# Patient Record
Sex: Female | Born: 1990 | Hispanic: No | Marital: Married | Smoking: Never smoker
Health system: Southern US, Community
[De-identification: ages and names within clinical notes are randomized; demographics above are authoritative.]

## PROBLEM LIST (undated history)

## (undated) DIAGNOSIS — Z8632 Personal history of gestational diabetes: Secondary | ICD-10-CM

## (undated) DIAGNOSIS — O09299 Supervision of pregnancy with other poor reproductive or obstetric history, unspecified trimester: Secondary | ICD-10-CM

## (undated) DIAGNOSIS — O24419 Gestational diabetes mellitus in pregnancy, unspecified control: Secondary | ICD-10-CM

## (undated) HISTORY — DX: Supervision of pregnancy with other poor reproductive or obstetric history, unspecified trimester: O09.299

## (undated) HISTORY — PX: WISDOM TOOTH EXTRACTION: SHX21

## (undated) HISTORY — PX: APPENDECTOMY: SHX54

## (undated) HISTORY — DX: Gestational diabetes mellitus in pregnancy, unspecified control: O24.419

## (undated) HISTORY — DX: Supervision of pregnancy with other poor reproductive or obstetric history, unspecified trimester: Z86.32

## (undated) HISTORY — PX: OTHER SURGICAL HISTORY: SHX169

---

## 2011-08-18 ENCOUNTER — Encounter (HOSPITAL_COMMUNITY): Payer: Self-pay | Admitting: Anesthesiology

## 2011-08-18 ENCOUNTER — Emergency Department (HOSPITAL_COMMUNITY): Payer: BC Managed Care – PPO

## 2011-08-18 ENCOUNTER — Encounter (HOSPITAL_COMMUNITY): Payer: Self-pay | Admitting: *Deleted

## 2011-08-18 ENCOUNTER — Emergency Department (HOSPITAL_COMMUNITY): Payer: BC Managed Care – PPO | Admitting: Anesthesiology

## 2011-08-18 ENCOUNTER — Observation Stay (HOSPITAL_COMMUNITY)
Admission: EM | Admit: 2011-08-18 | Discharge: 2011-08-19 | Disposition: A | Discharge status: Home / Self Care | Payer: BC Managed Care – PPO | Attending: General Surgery | Admitting: General Surgery

## 2011-08-18 ENCOUNTER — Encounter (HOSPITAL_COMMUNITY)
Admission: EM | Disposition: A | Discharge status: Home / Self Care | Payer: Self-pay | Source: Home / Self Care | Attending: Emergency Medicine

## 2011-08-18 DIAGNOSIS — K37 Unspecified appendicitis: Secondary | ICD-10-CM

## 2011-08-18 DIAGNOSIS — R1011 Right upper quadrant pain: Secondary | ICD-10-CM

## 2011-08-18 DIAGNOSIS — K3580 Unspecified acute appendicitis: Principal | ICD-10-CM | POA: Insufficient documentation

## 2011-08-18 HISTORY — PX: LAPAROSCOPIC APPENDECTOMY: SHX408

## 2011-08-18 LAB — DIFFERENTIAL
Basophils Absolute: 0 10*3/uL (ref 0.0–0.1)
Basophils Relative: 0 % (ref 0–1)
Eosinophils Absolute: 0.1 10*3/uL (ref 0.0–0.7)
Eosinophils Relative: 1 % (ref 0–5)

## 2011-08-18 LAB — WET PREP, GENITAL: Clue Cells Wet Prep HPF POC: NONE SEEN

## 2011-08-18 LAB — CBC
MCH: 28.8 pg (ref 26.0–34.0)
MCV: 83.8 fL (ref 78.0–100.0)
Platelets: 287 10*3/uL (ref 150–400)
RDW: 12.7 % (ref 11.5–15.5)

## 2011-08-18 LAB — COMPREHENSIVE METABOLIC PANEL
ALT: 10 U/L (ref 0–35)
AST: 13 U/L (ref 0–37)
Calcium: 8.9 mg/dL (ref 8.4–10.5)
GFR calc Af Amer: >90 mL/min (ref >90)
Sodium: 135 mEq/L (ref 135–145)
Total Protein: 7.1 g/dL (ref 6.0–8.3)

## 2011-08-18 SURGERY — APPENDECTOMY, LAPAROSCOPIC
Anesthesia: General | Site: Abdomen | Wound class: Clean Contaminated

## 2011-08-18 MED ORDER — MORPHINE SULFATE 4 MG/ML IJ SOLN
4.0000 mg | Freq: Once | INTRAMUSCULAR | Status: AC
  Administered 2011-08-18: 4 mg via INTRAVENOUS
  Filled 2011-08-18: qty 1

## 2011-08-18 MED ORDER — SODIUM CHLORIDE 0.9 % IV BOLUS (SEPSIS)
1000.0000 mL | Freq: Once | INTRAVENOUS | Status: AC
  Administered 2011-08-18: 1000 mL via INTRAVENOUS

## 2011-08-18 MED ORDER — PROPOFOL 10 MG/ML IV EMUL
INTRAVENOUS | Status: DC | PRN
  Administered 2011-08-18: 120 mg via INTRAVENOUS

## 2011-08-18 MED ORDER — DEXAMETHASONE SODIUM PHOSPHATE 10 MG/ML IJ SOLN
INTRAMUSCULAR | Status: DC | PRN
  Administered 2011-08-18: 6 mg via INTRAVENOUS

## 2011-08-18 MED ORDER — ACETAMINOPHEN 650 MG RE SUPP: 650.0000 mg | Freq: Four times a day (QID) | RECTAL | Status: DC | PRN

## 2011-08-18 MED ORDER — NEOSTIGMINE METHYLSULFATE 1 MG/ML IJ SOLN
INTRAMUSCULAR | Status: DC | PRN
  Administered 2011-08-18: 3 mg via INTRAVENOUS

## 2011-08-18 MED ORDER — IOHEXOL 300 MG/ML  SOLN
100.0000 mL | Freq: Once | INTRAMUSCULAR | Status: AC | PRN
  Administered 2011-08-18: 100 mL via INTRAVENOUS

## 2011-08-18 MED ORDER — FENTANYL CITRATE 0.05 MG/ML IJ SOLN
INTRAMUSCULAR | Status: DC | PRN
  Administered 2011-08-18 (×2): 50 ug via INTRAVENOUS

## 2011-08-18 MED ORDER — MIDAZOLAM HCL 5 MG/5ML IJ SOLN
INTRAMUSCULAR | Status: DC | PRN
  Administered 2011-08-18: 1.5 mg via INTRAVENOUS

## 2011-08-18 MED ORDER — MORPHINE SULFATE 2 MG/ML IJ SOLN: 2.0000 mg | INTRAMUSCULAR | Status: DC | PRN

## 2011-08-18 MED ORDER — KETOROLAC TROMETHAMINE 15 MG/ML IJ SOLN
INTRAMUSCULAR | Status: DC | PRN
  Administered 2011-08-18: 15 mg via INTRAVENOUS

## 2011-08-18 MED ORDER — LACTATED RINGERS IV SOLN: INTRAVENOUS | Status: DC

## 2011-08-18 MED ORDER — PIPERACILLIN-TAZOBACTAM 3.375 G IVPB
3.3750 g | Freq: Three times a day (TID) | INTRAVENOUS | Status: AC
  Administered 2011-08-18 – 2011-08-19 (×2): 3.375 g via INTRAVENOUS
  Filled 2011-08-18 (×3): qty 50

## 2011-08-18 MED ORDER — BUPIVACAINE HCL (PF) 0.25 % IJ SOLN
INTRAMUSCULAR | Status: AC
  Filled 2011-08-18: qty 30

## 2011-08-18 MED ORDER — CISATRACURIUM BESYLATE 2 MG/ML IV SOLN
INTRAVENOUS | Status: DC | PRN
  Administered 2011-08-18: 4 mg via INTRAVENOUS

## 2011-08-18 MED ORDER — ACETAMINOPHEN 325 MG PO TABS: 650.0000 mg | ORAL_TABLET | Freq: Four times a day (QID) | ORAL | Status: DC | PRN

## 2011-08-18 MED ORDER — SODIUM CHLORIDE 0.9 % IV SOLN
INTRAVENOUS | Status: DC
  Administered 2011-08-18: 21:00:00 via INTRAVENOUS

## 2011-08-18 MED ORDER — ONDANSETRON HCL 4 MG/2ML IJ SOLN: 4.0000 mg | Freq: Four times a day (QID) | INTRAMUSCULAR | Status: DC | PRN

## 2011-08-18 MED ORDER — GLYCOPYRROLATE 0.2 MG/ML IJ SOLN
INTRAMUSCULAR | Status: DC | PRN
  Administered 2011-08-18: 0.4 mg via INTRAVENOUS

## 2011-08-18 MED ORDER — HYDROMORPHONE HCL PF 1 MG/ML IJ SOLN: 0.2500 mg | INTRAMUSCULAR | Status: DC | PRN

## 2011-08-18 MED ORDER — BUPIVACAINE HCL (PF) 0.25 % IJ SOLN
INTRAMUSCULAR | Status: DC | PRN
  Administered 2011-08-18: 30 mL

## 2011-08-18 MED ORDER — LACTATED RINGERS IV SOLN
INTRAVENOUS | Status: DC | PRN
  Administered 2011-08-18: 1000 mL via INTRAVENOUS

## 2011-08-18 MED ORDER — PIPERACILLIN-TAZOBACTAM 3.375 G IVPB
3.3750 g | Freq: Once | INTRAVENOUS | Status: DC
  Filled 2011-08-18: qty 50

## 2011-08-18 MED ORDER — ONDANSETRON HCL 4 MG/2ML IJ SOLN
INTRAMUSCULAR | Status: DC | PRN
  Administered 2011-08-18: 4 mg via INTRAVENOUS

## 2011-08-18 MED ORDER — POTASSIUM CHLORIDE 20 MEQ/15ML (10%) PO LIQD
20.0000 meq | Freq: Once | ORAL | Status: AC
  Administered 2011-08-18: 20 meq via ORAL
  Filled 2011-08-18: qty 15

## 2011-08-18 MED ORDER — LACTATED RINGERS IV SOLN
INTRAVENOUS | Status: DC | PRN
  Administered 2011-08-18: 18:00:00 via INTRAVENOUS

## 2011-08-18 MED ORDER — SUCCINYLCHOLINE CHLORIDE 20 MG/ML IJ SOLN
INTRAMUSCULAR | Status: DC | PRN
  Administered 2011-08-18: 80 mg via INTRAVENOUS

## 2011-08-18 MED ORDER — OXYCODONE HCL 5 MG PO TABS: 5.0000 mg | ORAL_TABLET | ORAL | Status: DC | PRN

## 2011-08-18 MED FILL — Cisatracurium Besylate (PF) IV Soln 10 MG/5ML (2 MG/ML): INTRAVENOUS | Qty: 5 | Status: AC

## 2011-08-18 SURGICAL SUPPLY — 38 items
CABLE HIGH FREQUENCY MONO STRZ (ELECTRODE) ×2 IMPLANT
CANISTER SUCTION 2500CC (MISCELLANEOUS) ×2 IMPLANT
CHLORAPREP W/TINT 26ML (MISCELLANEOUS) ×2 IMPLANT
CLOTH BEACON ORANGE TIMEOUT ST (SAFETY) ×2 IMPLANT
COVER SURGICAL LIGHT HANDLE (MISCELLANEOUS) ×2 IMPLANT
CUTTER FLEX LINEAR 45M (STAPLE) ×2 IMPLANT
DECANTER SPIKE VIAL GLASS SM (MISCELLANEOUS) ×2 IMPLANT
DERMABOND ADVANCED (GAUZE/BANDAGES/DRESSINGS) ×1
DERMABOND ADVANCED .7 DNX12 (GAUZE/BANDAGES/DRESSINGS) ×1 IMPLANT
DRAPE LAPAROSCOPIC ABDOMINAL (DRAPES) ×2 IMPLANT
ELECT REM PT RETURN 9FT ADLT (ELECTROSURGICAL) ×2
ELECTRODE REM PT RTRN 9FT ADLT (ELECTROSURGICAL) ×1 IMPLANT
GLOVE BIO SURGEON STRL SZ7 (GLOVE) ×4 IMPLANT
GLOVE BIOGEL PI IND STRL 7.0 (GLOVE) ×1 IMPLANT
GLOVE BIOGEL PI IND STRL 7.5 (GLOVE) ×2 IMPLANT
GLOVE BIOGEL PI INDICATOR 7.0 (GLOVE) ×1
GLOVE BIOGEL PI INDICATOR 7.5 (GLOVE) ×2
GOWN PREVENTION PLUS LG XLONG (DISPOSABLE) ×2 IMPLANT
GOWN PREVENTION PLUS XLARGE (GOWN DISPOSABLE) ×2 IMPLANT
GOWN STRL NON-REIN LRG LVL3 (GOWN DISPOSABLE) ×2 IMPLANT
GOWN STRL REIN XL XLG (GOWN DISPOSABLE) ×2 IMPLANT
KIT BASIN OR (CUSTOM PROCEDURE TRAY) ×2 IMPLANT
NS IRRIG 1000ML POUR BTL (IV SOLUTION) ×2 IMPLANT
PENCIL BUTTON HOLSTER BLD 10FT (ELECTRODE) IMPLANT
POUCH SPECIMEN RETRIEVAL 10MM (ENDOMECHANICALS) ×2 IMPLANT
RELOAD 45 VASCULAR/THIN (ENDOMECHANICALS) IMPLANT
RELOAD STAPLE TA45 3.5 REG BLU (ENDOMECHANICALS) ×2 IMPLANT
SCALPEL HARMONIC ACE (MISCELLANEOUS) ×2 IMPLANT
SET IRRIG TUBING LAPAROSCOPIC (IRRIGATION / IRRIGATOR) ×2 IMPLANT
SOLUTION ANTI FOG 6CC (MISCELLANEOUS) ×2 IMPLANT
SUT MNCRL AB 4-0 PS2 18 (SUTURE) ×2 IMPLANT
SUT VICRYL 0 ENDOLOOP (SUTURE) IMPLANT
TOWEL OR 17X26 10 PK STRL BLUE (TOWEL DISPOSABLE) ×2 IMPLANT
TRAY FOLEY CATH 14FRSI W/METER (CATHETERS) ×2 IMPLANT
TRAY LAP CHOLE (CUSTOM PROCEDURE TRAY) ×2 IMPLANT
TROCAR BLADELESS OPT 5 75 (ENDOMECHANICALS) ×4 IMPLANT
TROCAR XCEL BLUNT TIP 100MML (ENDOMECHANICALS) ×2 IMPLANT
TUBING INSUFFLATION 10FT LAP (TUBING) ×2 IMPLANT

## 2011-08-18 NOTE — ED Provider Notes (Signed)
Abdominal CT results showed appendicitis. Spoke with surgeon on call he will come to admit  Martha Baker, MD 08/18/11 1646

## 2011-08-18 NOTE — Anesthesia Postprocedure Evaluation (Signed)
  Anesthesia Post-op Note  Patient: Martha Gutierrez  Procedure(s) Performed: Procedure(s) (LRB): APPENDECTOMY LAPAROSCOPIC (N/A)  Patient Location: PACU  Anesthesia Type: General  Level of Consciousness: oriented and sedated  Airway and Oxygen Therapy: Patient Spontanous Breathing  Post-op Pain: mild  Post-op Assessment: Post-op Vital signs reviewed, Patient's Cardiovascular Status Stable, Respiratory Function Stable and Patent Airway  Post-op Vital Signs: stable  Complications: No apparent anesthesia complications

## 2011-08-18 NOTE — ED Provider Notes (Signed)
History     CSN: [id number]  Arrival date & time 08/18/11  1235   First MD Initiated Contact with Patient 08/18/11 1301      Chief Complaint  Patient presents with  . Abdominal Pain    RLQ    (Consider location/radiation/quality/duration/timing/severity/associated sxs/prior treatment) HPI  20yoF reason healthy presents with right lower quadrant pain. The patient complains of diffuse lower abdominal pain which has been sharp and crampy since yesterday. She states that she is having slightly more pain in her right lower portion. She denies nausea, vomiting, diarrhea. Her last bowel movement was yesterday and was normal. She denies back pain. Denies hematuria/dysuria/freq/urgency. SHe does state that she feels pressure while she is urinating. Denies fevers, chills. Denies vaginal discharge. There is no history of sexually transmitted infections.  Denies history of abdominal surgeries. She was seen prior to arrival at urgent care and had negative urine pregnancy as well as urinalysis which was unremarkable. Referred to ED for further evaluation and w/u, r/o appendicitis.  ED Notes, ED Provider Notes from 08/18/11 0000 to 08/18/11 13:01:21       Alvina Chou, RN 08/18/2011 12:59      Pt sent from Urgent Care with RLQ pain that started yesterday morning and sharp pain started last night. Pt denies N/V/D, fever, recent surgeries or injury.     History reviewed. No pertinent past medical history.  Past Surgical History  Procedure Date  . None     History reviewed. No pertinent family history.  History  Substance Use Topics  . Smoking status: Never Smoker   . Smokeless tobacco: Never Used  . Alcohol Use: Yes     socially    OB History    Grav Para Term Preterm Abortions TAB SAB Ect Mult Living                  Review of Systems  All other systems reviewed and are negative.    Allergies  Review of patient's allergies indicates no known allergies.  Home Medications    Current Outpatient Rx  Name Route Sig Dispense Refill  . BIOTIN PO Oral Take 1 tablet by mouth daily.    . DROSPIRENONE-ETHINYL ESTRADIOL 3-0.02 MG PO TABS Oral Take 1 tablet by mouth daily.    . IBUPROFEN 200 MG PO TABS Oral Take 200-400 mg by mouth every 8 (eight) hours as needed. For pain.    Marland Kitchen VITAMIN C 500 MG PO TABS Oral Take 500 mg by mouth daily.    Marland Kitchen ZINC SULFATE 220 MG PO CAPS Oral Take 220 mg by mouth daily.      BP 121/74  Pulse 97  Temp(Src) 98.3 F (36.8 C) (Oral)  Resp 16  Ht 5\' 4"  (1.626 m)  Wt 99 lb (44.906 kg)  BMI 16.99 kg/m2  SpO2 100%  LMP 08/04/2011  Physical Exam  Nursing note and vitals reviewed. Constitutional: She is oriented to person, place, and time. She appears well-developed.  HENT:  Head: Atraumatic.  Mouth/Throat: Oropharynx is clear and moist.  Eyes: Conjunctivae and EOM are normal. Pupils are equal, round, and reactive to light.  Neck: Normal range of motion. Neck supple.  Cardiovascular: Normal rate, regular rhythm, normal heart sounds and intact distal pulses.   Pulmonary/Chest: Effort normal and breath sounds normal. No respiratory distress. She has no wheezes. She has no rales.  Abdominal: Soft. She exhibits no distension. There is tenderness. There is no rebound and no guarding.       +  RLQ ttp +rovsing No r/g  Genitourinary: Vaginal discharge found.       Min white vaginal d/c No CMT Min R adnexal ttp  Musculoskeletal: Normal range of motion.  Neurological: She is alert and oriented to person, place, and time.  Skin: Skin is warm and dry. No rash noted.  Psychiatric: She has a normal mood and affect.    ED Course  Procedures (including critical care time)  Labs Reviewed  CBC - Abnormal; Notable for the following:    WBC 13.3 (*)    All other components within normal limits  DIFFERENTIAL - Abnormal; Notable for the following:    Neutro Abs 9.8 (*)    All other components within normal limits  COMPREHENSIVE METABOLIC  PANEL - Abnormal; Notable for the following:    Potassium 3.4 (*)    All other components within normal limits  WET PREP, GENITAL - Abnormal; Notable for the following:    WBC, Wet Prep HPF POC MANY (*)    All other components within normal limits  GC/CHLAMYDIA PROBE AMP, GENITAL   No results found.   No diagnosis found.   MDM  Previously healthy young female with lower abdominal pain > RLQ. U/A unremarkable and urine preg negative at Urgent care prior to arrival. Labs remarkable for slight leukocytosis with left shift. CT A/P pending to evaluate for appendicitis, colitis. She does have min vaginal discharge on exam without CMT, mild R adnexal ttp. She does not complain of vaginal discharge. I have lower suspicion for ovarian cyst/torsion/TOA.  IVF, morphine ordered. Reassess.        Forbes Cellar, MD 08/18/11 1501

## 2011-08-18 NOTE — Anesthesia Preprocedure Evaluation (Signed)
Anesthesia Evaluation  Patient identified by MRN, date of birth, ID band Patient awake  General Assessment Comment:CT scan contrast 14:00  Reviewed: Allergy & Precautions, H&P , NPO status , Patient's Chart, lab work & pertinent test results, reviewed documented beta blocker date and time   Airway Mallampati: II TM Distance: >3 FB Neck ROM: Full    Dental  (+) Teeth Intact and Dental Advisory Given   Pulmonary neg pulmonary ROS,  breath sounds clear to auscultation        Cardiovascular negative cardio ROS  Rhythm:Regular Rate:Normal     Neuro/Psych negative neurological ROS  negative psych ROS   GI/Hepatic negative GI ROS, Neg liver ROS,   Endo/Other  negative endocrine ROS  Renal/GU negative Renal ROS  negative genitourinary   Musculoskeletal negative musculoskeletal ROS (+)   Abdominal   Peds negative pediatric ROS (+)  Hematology negative hematology ROS (+)   Anesthesia Other Findings   Reproductive/Obstetrics negative OB ROS                           Anesthesia Physical Anesthesia Plan  ASA: I and Emergent  Anesthesia Plan: General   Post-op Pain Management:    Induction: Intravenous, Rapid sequence and Cricoid pressure planned  Airway Management Planned: Oral ETT  Additional Equipment:   Intra-op Plan:   Post-operative Plan: Extubation in OR  Informed Consent: I have reviewed the patients History and Physical, chart, labs and discussed the procedure including the risks, benefits and alternatives for the proposed anesthesia with the patient or authorized representative who has indicated his/her understanding and acceptance.   Dental advisory given  Plan Discussed with: CRNA and Surgeon  Anesthesia Plan Comments:         Anesthesia Quick Evaluation

## 2011-08-18 NOTE — ED Notes (Signed)
Unable to obtain consent due to absence of the surgeon

## 2011-08-18 NOTE — Transfer of Care (Signed)
Immediate Anesthesia Transfer of Care Note  Patient: Martha Gutierrez  Procedure(s) Performed: Procedure(s) (LRB): APPENDECTOMY LAPAROSCOPIC (N/A)  Patient Location: PACU  Anesthesia Type: General  Level of Consciousness: awake, sedated and patient cooperative  Airway & Oxygen Therapy: Patient Spontanous Breathing and Patient connected to nasal cannula oxygen  Post-op Assessment: Report given to PACU RN and Post -op Vital signs reviewed and stable  Post vital signs: Reviewed and stable  Complications: No apparent anesthesia complications

## 2011-08-18 NOTE — H&P (Signed)
Martha Gutierrez is an 21 y.o. female.   Chief Complaint: abdominal pain HPI:  7 yof who has about 24 hour history of periumbilical pain now in RLQ.  No n/v/fevers/change in bowel habits.  Anorectic.  No prior history.  Hurts when she moves and nothing was making it better. Presented to er, ct with appendicitis.  History reviewed. No pertinent past medical history.  Past Surgical History  Procedure Date  . None     History reviewed. No pertinent family history.  Social History:  reports that she has never smoked. She has never used smokeless tobacco. She reports that she drinks alcohol. She reports that she does not use illicit drugs.  Allergies: No Known Allergies  Meds: vitamins, ocps  Results for orders placed during the hospital encounter of 08/18/11 (from the past 48 hour(s))  CBC     Status: Abnormal   Collection Time   08/18/11  1:43 PM      Component Value Range Comment   WBC 13.3 (*) 4.0 - 10.5 (K/uL)    RBC 4.44  3.87 - 5.11 (MIL/uL)    Hemoglobin 12.8  12.0 - 15.0 (g/dL)    HCT 78.2  95.6 - 21.3 (%)    MCV 83.8  78.0 - 100.0 (fL)    MCH 28.8  26.0 - 34.0 (pg)    MCHC 34.4  30.0 - 36.0 (g/dL)    RDW 08.6  57.8 - 46.9 (%)    Platelets 287  150 - 400 (K/uL)   DIFFERENTIAL     Status: Abnormal   Collection Time   08/18/11  1:43 PM      Component Value Range Comment   Neutrophils Relative 73  43 - 77 (%)    Neutro Abs 9.8 (*) 1.7 - 7.7 (K/uL)    Lymphocytes Relative 18  12 - 46 (%)    Lymphs Abs 2.4  0.7 - 4.0 (K/uL)    Monocytes Relative 8  3 - 12 (%)    Monocytes Absolute 1.0  0.1 - 1.0 (K/uL)    Eosinophils Relative 1  0 - 5 (%)    Eosinophils Absolute 0.1  0.0 - 0.7 (K/uL)    Basophils Relative 0  0 - 1 (%)    Basophils Absolute 0.0  0.0 - 0.1 (K/uL)   COMPREHENSIVE METABOLIC PANEL     Status: Abnormal   Collection Time   08/18/11  1:43 PM      Component Value Range Comment   Sodium 135  135 - 145 (mEq/L)    Potassium 3.4 (*) 3.5 - 5.1 (mEq/L)    Chloride 100  96 - 112 (mEq/L)    CO2 25  19 - 32 (mEq/L)    Glucose, Bld 84  70 - 99 (mg/dL)    BUN 9  6 - 23 (mg/dL)    Creatinine, Ser 6.29  0.50 - 1.10 (mg/dL)    Calcium 8.9  8.4 - 10.5 (mg/dL)    Total Protein 7.1  6.0 - 8.3 (g/dL)    Albumin 3.5  3.5 - 5.2 (g/dL)    AST 13  0 - 37 (U/L)    ALT 10  0 - 35 (U/L)    Alkaline Phosphatase 52  39 - 117 (U/L)    Total Bilirubin 0.3  0.3 - 1.2 (mg/dL)    GFR calc non Af Amer >90  >90 (mL/min)    GFR calc Af Amer >90  >90 (mL/min)   WET PREP, GENITAL  Status: Abnormal   Collection Time   08/18/11  1:45 PM      Component Value Range Comment   Yeast Wet Prep HPF POC NONE SEEN  NONE SEEN     Trich, Wet Prep NONE SEEN  NONE SEEN     Clue Cells Wet Prep HPF POC NONE SEEN  NONE SEEN     WBC, Wet Prep HPF POC MANY (*) NONE SEEN     Ct Abdomen Pelvis W Contrast  08/18/2011  *RADIOLOGY REPORT*  Clinical Data: Right lower quadrant pain  CT ABDOMEN AND PELVIS WITH CONTRAST  Technique:  Multidetector CT imaging of the abdomen and pelvis was performed following the standard protocol during bolus administration of intravenous contrast.  Contrast: OMNIPAQUE IOHEXOL 300 MG/ML  SOLN  Comparison: None.  Findings: The appendix is dilated, thick, and hyperemic.  There is peri appendiceal stranding and a small amount of free fluid along the right pericolic gutter.  There is no pneumoperitoneum or abscess.  The lung bases are clear.  The liver, gallbladder, pancreas, adrenal glands, kidneys, urinary bladder, osseous structures have a normal appearance.  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 There is no adenopathy within the abdomen or pelvis.  IMPRESSION: There is  acute appendicitis.  No pneumoperitoneum or abscess are identified.  Findings were discussed with Dr. Freida Busman at the time of the exam.  Original Report Authenticated By: Brandon Melnick, M.D.    Review of Systems  Constitutional: Negative for fever, chills and weight loss.    Gastrointestinal: Positive for abdominal pain. Negative for nausea, vomiting, diarrhea and constipation.    Blood pressure 113/61, pulse 97, temperature 98 F (36.7 C), temperature source Oral, resp. rate 18, height 5\' 4"  (1.626 m), weight 99 lb (44.906 kg), last menstrual period 08/04/2011, SpO2 100.00%. Physical Exam  Vitals reviewed. Constitutional: She appears well-developed and well-nourished.  Eyes: No scleral icterus.  Cardiovascular: Normal rate, regular rhythm and normal heart sounds.   Respiratory: Effort normal and breath sounds normal. She has no wheezes. She has no rales.  GI: Soft. Bowel sounds are normal. There is tenderness in the right lower quadrant.     Assessment/Plan Acute appendicitis  We discussed pathophysiology of appendicitis.  She appears to have acute nonperforated appendicitis.  We discussed laparoscopic appendectomy.  Risks include but not limited to bleeding, open procedure, infection, postop abscess requiring drainage.  She is getting married at end of month and should be fine by then.  Martha Gutierrez 08/18/2011, 5:41 PM

## 2011-08-18 NOTE — ED Notes (Signed)
Pt sent from Urgent Care with RLQ pain that started yesterday morning and sharp pain started last night. Pt denies N/V/D, fever, recent surgeries or injury.

## 2011-08-18 NOTE — Op Note (Signed)
ppreoperative diagnosis: Acute appendicitis Postoperative diagnosis: Same as above Procedure: Laparoscopic appendectomy Surgeon: Dr. Harden Mo Anesthesia: Gen. Endotracheal Specimens: Appendix Estimated blood loss: Minimal Drains: None Sponge and needle count correct x2 at end of operation Disposition to recovery in stable condition  Indications: This is a 21 year old female with right lower quadrant pain, elevated white blood cell count and a CT scan consistent with acute appendicitis. I discussed a laparoscopic appendectomy with her.  Procedure: She was first administered Zosyn in the emergency room. She was then brought to the operating room. She had sequential compression devices placed. She then underwent general endotracheal anesthesia. Her abdomen was prepped and draped in the standard sterile surgical fashion. She had a Foley catheter in place. A surgical timeout was then performed.  I infiltrated quarter percent Marcaine below her umbilicus. I made a curvilinear incision in the fold of her umbilicus. I then carried this out down to the level of the fascia. This was entered sharply and the peritoneum was entered bluntly. I then placed a Hassan trocar. I then placed 2 further 5 mm trocars in the suprapubic region and left lower quadrant after infiltration with local anesthetic under direct vision. Her appendix was noted to be inflamed. The base was clean. I dissected the base free from the mesentery with the dissector. I then used a GIA stapler to divide the appendix at its base. The harmonic scalpel was used to divide the mesentery. I then placed the appendix in an Endo Catch bag and removed from the umbilicus. Irrigation was performed. There was a small bleeding area on the staple line I cauterized. A small amount of fluid was also evacuated from the pelvis. I then removed the Hasson trocar and I closed the fascial defect with 2 figure-of-eight Vicryl sutures. This completely obliterated  the defect. I then desufflated the abdomen and removed all trocars. The incisions were closed with 4-0 Monocryl and Dermabond. She tolerated this well was extubated and transferred to recovery in stable condition.

## 2011-08-18 NOTE — Discharge Instructions (Signed)
[facility] LAPAROSCOPIC SURGERY: POST OP INSTRUCTIONS  Always review your discharge instruction sheet given to you by the facility where your surgery was performed. IF YOU HAVE DISABILITY OR FAMILY LEAVE FORMS, YOU MUST BRING THEM TO THE OFFICE FOR PROCESSING.   DO NOT GIVE THEM TO YOUR DOCTOR.  1. A prescription for pain medication may be given to you upon discharge.  Take your pain medication as prescribed, if needed.  If narcotic pain medicine is not needed, then you may take acetaminophen (Tylenol), naprosyn (Alleve), or ibuprofen (Advil) as needed. 2. Take your usually prescribed medications unless otherwise directed. 3. If you need a refill on your pain medication, please contact your pharmacy.  They will contact our office to request authorization. Prescriptions will not be filled after 5pm or on week-ends. 4. You should follow a light diet the first few days after arrival home, such as soup and crackers, etc.  Be sure to include lots of fluids daily. 5. Most patients will experience some swelling and bruising in the area of the incisions.  Ice packs will help.  Swelling and bruising can take several days to resolve.  6. It is common to experience some constipation if taking pain medication after surgery.  Increasing fluid intake and taking a stool softener (such as Colace) will usually help or prevent this problem from occurring.  A mild laxative (Milk of Magnesia or Miralax) should be taken according to package instructions if there are no bowel movements after 48 hours. 7. Unless discharge instructions indicate otherwise, you may remove your bandages 48 hours after surgery, and you may shower at that time.  You may have steri-strips (small skin tapes) in place directly over the incision.  These strips should be left on the skin for 7-10 days.  If your surgeon used skin glue on the incision, you may shower in 24 hours.  The glue will flake  off over the next 2-3 weeks.  Any sutures or staples will be removed at the office during your follow-up visit. 8. ACTIVITIES:  You may resume regular (light) daily activities beginning the next day--such as daily self-care, walking, climbing stairs--gradually increasing activities as tolerated.  You may have sexual intercourse when it is comfortable.  Refrain from any heavy lifting or straining until approved by your doctor. a. You may drive when you are no longer taking prescription pain medication, you can comfortably wear a seatbelt, and you can safely maneuver your car and apply brakes. b. RETURN TO WORK:  __________________________________________________________ 9. You should see your doctor in the office for a follow-up appointment approximately 2-3 weeks after your surgery.  Make sure that you call for this appointment within a day or two after you arrive home to insure a convenient appointment time. 10. OTHER INSTRUCTIONS: __________________________________________________________________________________________________________________________ __________________________________________________________________________________________________________________________ WHEN TO CALL YOUR DOCTOR: 1. Fever over 101.0 2. Inability to urinate 3. Continued bleeding from incision. 4. Increased pain, redness, or drainage from the incision. 5. Increasing abdominal pain  The clinic staff is available to answer your questions during regular business hours.  Please don't hesitate to call and ask to speak to one of the nurses for clinical concerns.  If you have a medical emergency, go to the nearest emergency room or call 911.  A surgeon from [facility] is always on call at the hospital. [address], [address], [place], [place]  [address] ? [address] [address], [place], [place]   [address] [phone] ? [phone] ? [phone]   [phone] Web site: [email or web address]  

## 2011-08-19 ENCOUNTER — Encounter (HOSPITAL_COMMUNITY): Payer: Self-pay | Admitting: *Deleted

## 2011-08-19 MED ORDER — OXYCODONE HCL 5 MG PO TABS: 5.0000 mg | ORAL_TABLET | ORAL | Status: DC | PRN

## 2011-08-19 NOTE — Discharge Summary (Signed)
Physician Discharge Summary  Patient ID: Martha Gutierrez MRN: [id number] DOB/AGE: 04/27/1990 20 y.o.  Admit date: 08/18/2011 Discharge date: 08/19/2011  Admission Diagnoses: Acute appendicitis  Discharge Diagnoses:  Acute appendicitis  Discharged Condition: good  Hospital Course: 15 yof admitted for acute appendicitis and underwent uneventful laparoscopic appendectomy.  Doing well postoperatively and will be discharged home.  Consults: None  Significant Diagnostic Studies: none   Treatments: surgery: laparoscopic appendectomy  Discharge Exam: Blood pressure 89/50, pulse 94, temperature 98.3 F (36.8 C), temperature source Oral, resp. rate 16, height 5\' 4"  (1.626 m), weight 99 lb (44.906 kg), last menstrual period 08/04/2011, SpO2 99.00%. Incision/Wound:clean without infection  Disposition: Home  Medication List  As of 08/19/2011  8:46 AM   TAKE these medications         BIOTIN PO   Take 1 tablet by mouth daily.      drospirenone-ethinyl estradiol 3-0.02 MG tablet   Commonly known as: YAZ,GIANVI,LORYNA   Take 1 tablet by mouth daily.      ibuprofen 200 MG tablet   Commonly known as: ADVIL,MOTRIN   Take 200-400 mg by mouth every 8 (eight) hours as needed. For pain.      oxyCODONE 5 MG immediate release tablet   Commonly known as: Oxy IR/ROXICODONE   Take 1 tablet (5 mg total) by mouth every 4 (four) hours as needed for pain.      vitamin C 500 MG tablet   Commonly known as: ASCORBIC ACID   Take 500 mg by mouth daily.      zinc sulfate 220 MG capsule   Take 220 mg by mouth daily.           Follow-up Information    Follow up with [facility], [place]. Schedule an appointment as soon as possible for a visit in 2 weeks.   Contact information:   [employer] [address] [place] [address] [phone]          Signed: Emelia Loron 08/19/2011, 8:46 AM

## 2011-08-20 ENCOUNTER — Telehealth (INDEPENDENT_AMBULATORY_CARE_PROVIDER_SITE_OTHER): Payer: Self-pay | Admitting: General Surgery

## 2011-08-20 ENCOUNTER — Encounter (HOSPITAL_COMMUNITY): Payer: Self-pay | Admitting: General Surgery

## 2011-08-21 LAB — GC/CHLAMYDIA PROBE AMP, GENITAL: Chlamydia, DNA Probe: NEGATIVE

## 2011-08-21 NOTE — Anesthesia Postprocedure Evaluation (Signed)
  Anesthesia Post-op Note  Patient: Martha Gutierrez  Procedure(s) Performed: Procedure(s) (LRB): APPENDECTOMY LAPAROSCOPIC (N/A)  Patient Location: PACU  Anesthesia Type: General  Level of Consciousness: oriented and sedated  Airway and Oxygen Therapy: Patient connected to nasal cannula oxygen  Post-op Pain: mild  Post-op Assessment: Post-op Vital signs reviewed, Patient's Cardiovascular Status Stable, Respiratory Function Stable and Patent Airway  Post-op Vital Signs: stable  Complications: No apparent anesthesia complications

## 2011-08-27 ENCOUNTER — Encounter (INDEPENDENT_AMBULATORY_CARE_PROVIDER_SITE_OTHER): Payer: Self-pay | Admitting: General Surgery

## 2011-08-27 ENCOUNTER — Ambulatory Visit (INDEPENDENT_AMBULATORY_CARE_PROVIDER_SITE_OTHER): Payer: BC Managed Care – PPO | Admitting: General Surgery

## 2011-08-27 VITALS — BP 96/64 | HR 66 | Temp 97.1°F | Resp 14 | Ht 65.0 in | Wt 99.2 lb

## 2011-08-27 DIAGNOSIS — Z09 Encounter for follow-up examination after completed treatment for conditions other than malignant neoplasm: Secondary | ICD-10-CM

## 2011-08-27 NOTE — Progress Notes (Signed)
Subjective:     Patient ID: Martha Gutierrez, female   DOB: 01/28/1991, 21 y.o.   MRN: [id number]  HPI This is a 21 year old female who I met in the emergency room with acute appendicitis. I did a laparoscopic appendectomy for acute appendicitis. Her pathology is consistent with that as well. She returns  today doing well without any complaints.  Review of Systems     Objective:   Physical Exam Healing incisions without infection    Assessment:     S/p lap appy for acute appendicitis    Plan:     May return to full activity Return to see me as needed

## 2012-02-24 ENCOUNTER — Emergency Department (HOSPITAL_COMMUNITY)
Admission: EM | Admit: 2012-02-24 | Discharge: 2012-02-25 | Disposition: A | Discharge status: Home / Self Care | Attending: Emergency Medicine | Admitting: Emergency Medicine

## 2012-02-24 ENCOUNTER — Encounter (HOSPITAL_COMMUNITY): Payer: Self-pay | Admitting: Emergency Medicine

## 2012-02-24 ENCOUNTER — Emergency Department (HOSPITAL_COMMUNITY)

## 2012-02-24 DIAGNOSIS — J3489 Other specified disorders of nose and nasal sinuses: Secondary | ICD-10-CM | POA: Insufficient documentation

## 2012-02-24 DIAGNOSIS — R059 Cough, unspecified: Secondary | ICD-10-CM | POA: Insufficient documentation

## 2012-02-24 DIAGNOSIS — R05 Cough: Secondary | ICD-10-CM | POA: Insufficient documentation

## 2012-02-24 MED ORDER — HYDROCOD POLST-CHLORPHEN POLST 10-8 MG/5ML PO LQCR
5.0000 mL | Freq: Once | ORAL | Status: AC
  Administered 2012-02-24: 5 mL via ORAL
  Filled 2012-02-24: qty 5

## 2012-02-24 NOTE — ED Notes (Signed)
Pt c/o cough w/pain to left lower ribs when breathing. Pt has a  Cough x2days. Pt has had yellowish mucous.VSS.PWD

## 2012-02-24 NOTE — ED Provider Notes (Signed)
History     CSN: [id number]  Arrival date & time 02/24/12  2206   First MD Initiated Contact with Patient 02/24/12 2323      Chief Complaint  Patient presents with  . Chest Pain    (Consider location/radiation/quality/duration/timing/severity/associated sxs/prior treatment) HPI Comments: Patient with URI symptoms for the past several days has intermittently taken OTC meds without much relief  developed nonproductive cough several days ago and L lower rib pain with deep breath/cough 1 day ago Denies use of Hormone BC, recent travel Hx DVT/PE    Patient is a 21 y.o. female presenting with chest pain. The history is provided by the patient.  Chest Pain The chest pain began yesterday. Chest pain occurs frequently. The chest pain is unchanged. The pain is associated with coughing. At its most intense, the pain is at 6/10. The pain is currently at 6/10. The severity of the pain is moderate. The pain does not radiate. Chest pain is worsened by deep breathing. Primary symptoms include cough. Pertinent negatives for primary symptoms include no fever, no syncope, no shortness of breath, no wheezing, no palpitations, no nausea and no vomiting. She tried NSAIDs for the symptoms.     History reviewed. No pertinent past medical history.  Past Surgical History  Procedure Date  . None   . Laparoscopic appendectomy 08/18/2011    Procedure: APPENDECTOMY LAPAROSCOPIC;  Surgeon: Emelia Loron, MD;  Location: [facility];  Service: General;  Laterality: N/A;    Family History  Problem Relation Age of Onset  . Cancer Mother     lymphoma    History  Substance Use Topics  . Smoking status: Never Smoker   . Smokeless tobacco: Never Used  . Alcohol Use: Yes     Comment: socially    OB History    Grav Para Term Preterm Abortions TAB SAB Ect Mult Living                  Review of Systems  Constitutional: Negative for fever and chills.  HENT: Positive for sore throat, rhinorrhea and postnasal  drip. Negative for ear pain, sinus pressure and ear discharge.   Respiratory: Positive for cough. Negative for shortness of breath and wheezing.   Cardiovascular: Positive for chest pain. Negative for palpitations and syncope.  Gastrointestinal: Negative for nausea and vomiting.  Genitourinary: Negative.   Skin: Negative for rash.  Neurological: Negative.     Allergies  Review of patient's allergies indicates no known allergies.  Home Medications   Current Outpatient Rx  Name  Route  Sig  Dispense  Refill  . IBUPROFEN 200 MG PO TABS   Oral   Take 200-400 mg by mouth every 8 (eight) hours as needed. For pain.         Marland Kitchen HYDROCOD POLST-CPM POLST ER 10-8 MG/5ML PO LQCR   Oral   Take 5 mLs by mouth every 12 (twelve) hours as needed.   140 mL   0     BP 102/67  Pulse 82  Temp 98.7 F (37.1 C) (Oral)  Resp 18  SpO2 99%  LMP 02/05/2012  Physical Exam  Constitutional: She is oriented to person, place, and time. She appears well-developed and well-nourished.  HENT:  Head: Normocephalic.  Eyes: Pupils are equal, round, and reactive to light.  Neck: Normal range of motion.  Cardiovascular: Normal rate and regular rhythm.   Pulmonary/Chest: Effort normal and breath sounds normal. No respiratory distress. She has no wheezes. She exhibits tenderness.  Abdominal: Soft. Bowel sounds are normal.  Musculoskeletal: Normal range of motion.  Neurological: She is alert and oriented to person, place, and time.  Skin: Skin is warm. No rash noted.    ED Course  Procedures (including critical care time)  Labs Reviewed - No data to display Dg Chest 2 View  02/24/2012  *RADIOLOGY REPORT*  Clinical Data: Chest pain.  CHEST - 2 VIEW  Comparison: None  Findings: Heart and mediastinal contours are within normal limits. No focal opacities or effusions.  No acute bony abnormality.  IMPRESSION: No active cardiopulmonary disease.   Original Report Authenticated By: Charlett Nose, M.D.      1.  Cough       MDM  Reviewed xray negative for PN Unlike PE due to lack of risk factors  Patient did get relief with medication         Arman Filter, NP 02/25/12 2956

## 2012-02-25 MED ORDER — HYDROCOD POLST-CHLORPHEN POLST 10-8 MG/5ML PO LQCR: 5.0000 mL | Freq: Two times a day (BID) | ORAL | Status: DC | PRN

## 2012-02-25 NOTE — ED Provider Notes (Signed)
Medical screening examination/treatment/procedure(s) were performed by non-physician practitioner and as supervising physician I was immediately available for consultation/collaboration.  Sunnie Nielsen, MD 02/25/12 [phone]

## 2012-02-25 NOTE — ED Notes (Signed)
Family at bedside. 

## 2013-11-13 IMAGING — CR DG CHEST 2V
2 series · 2 of 2 positions shown · non-contrast
Comparison: None

CLINICAL DATA: Chest pain.

CHEST - 2 VIEW

[w chest pa]
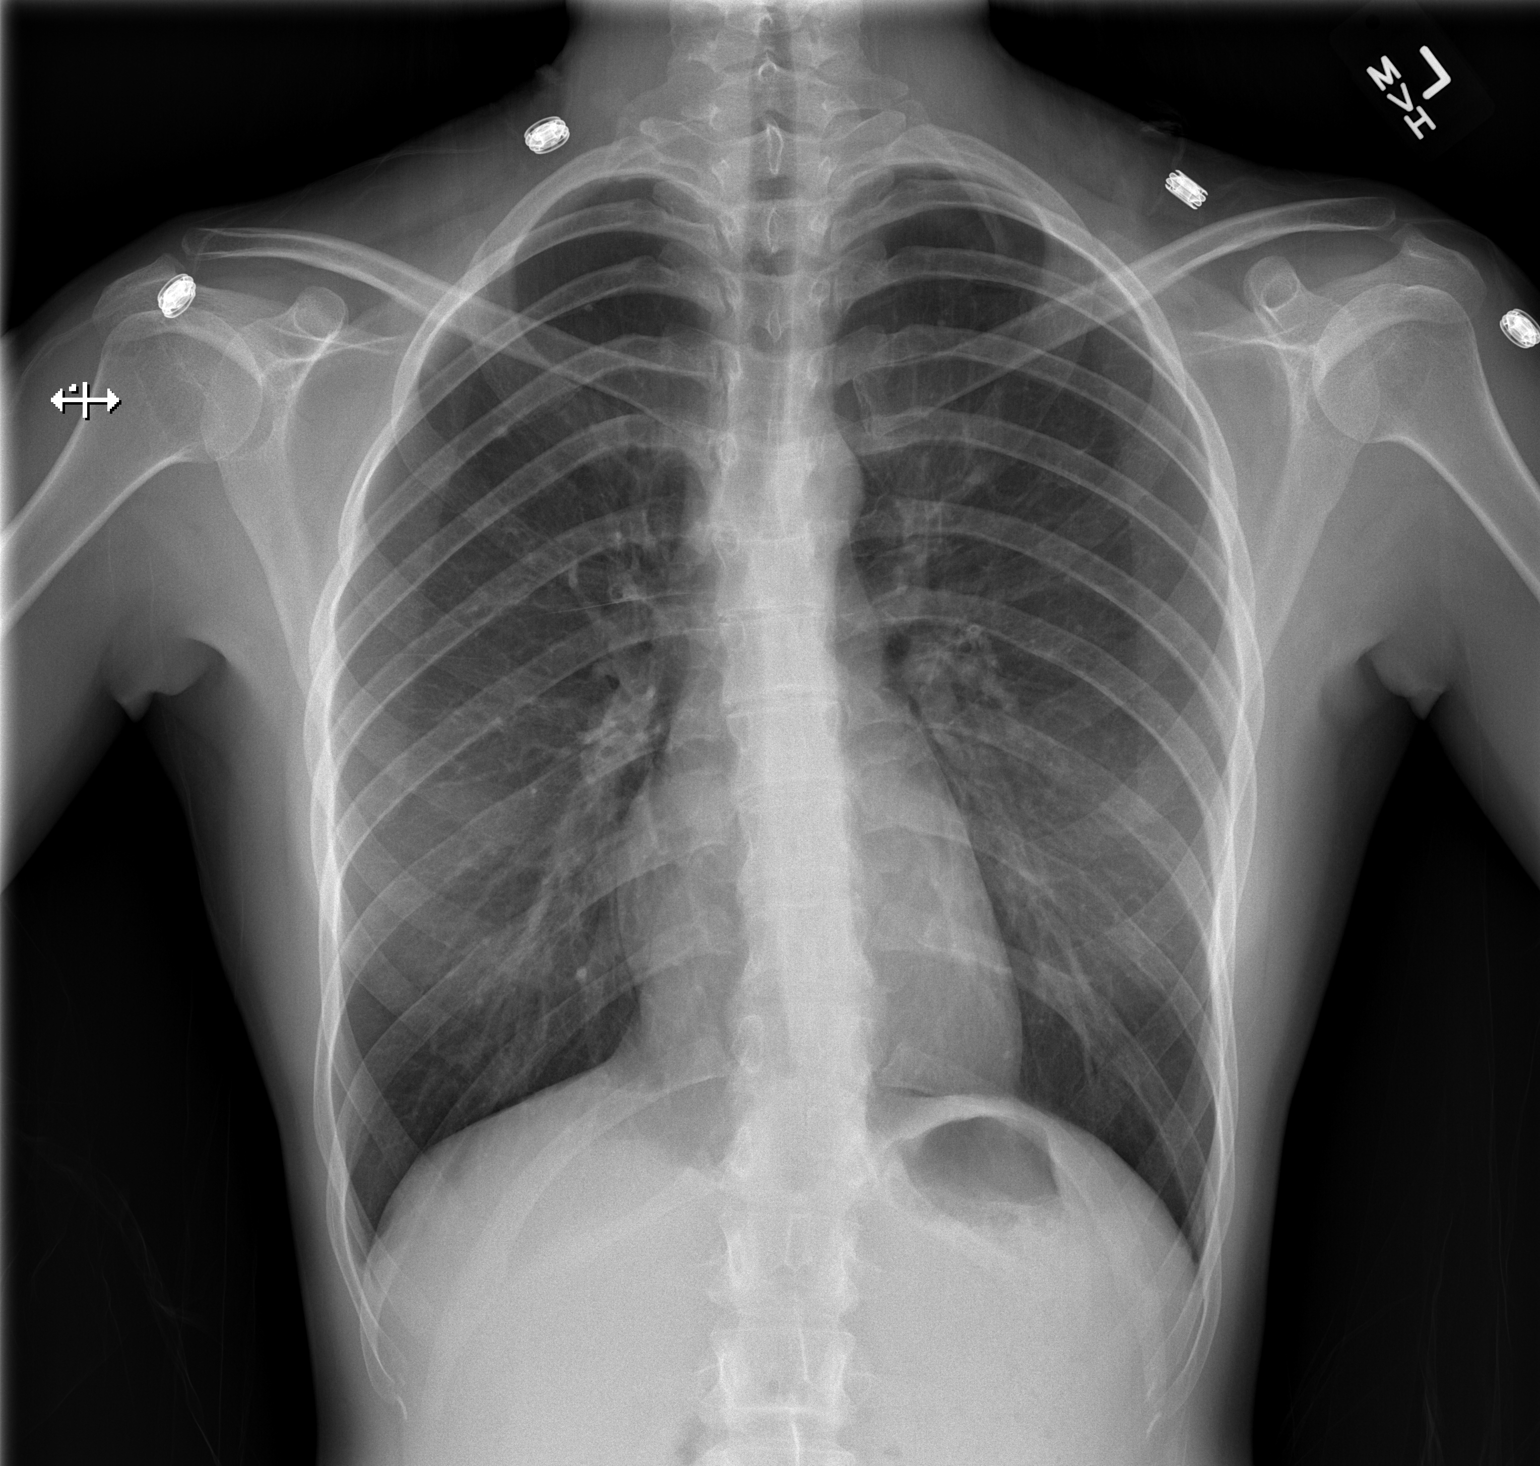

[w chest lat]
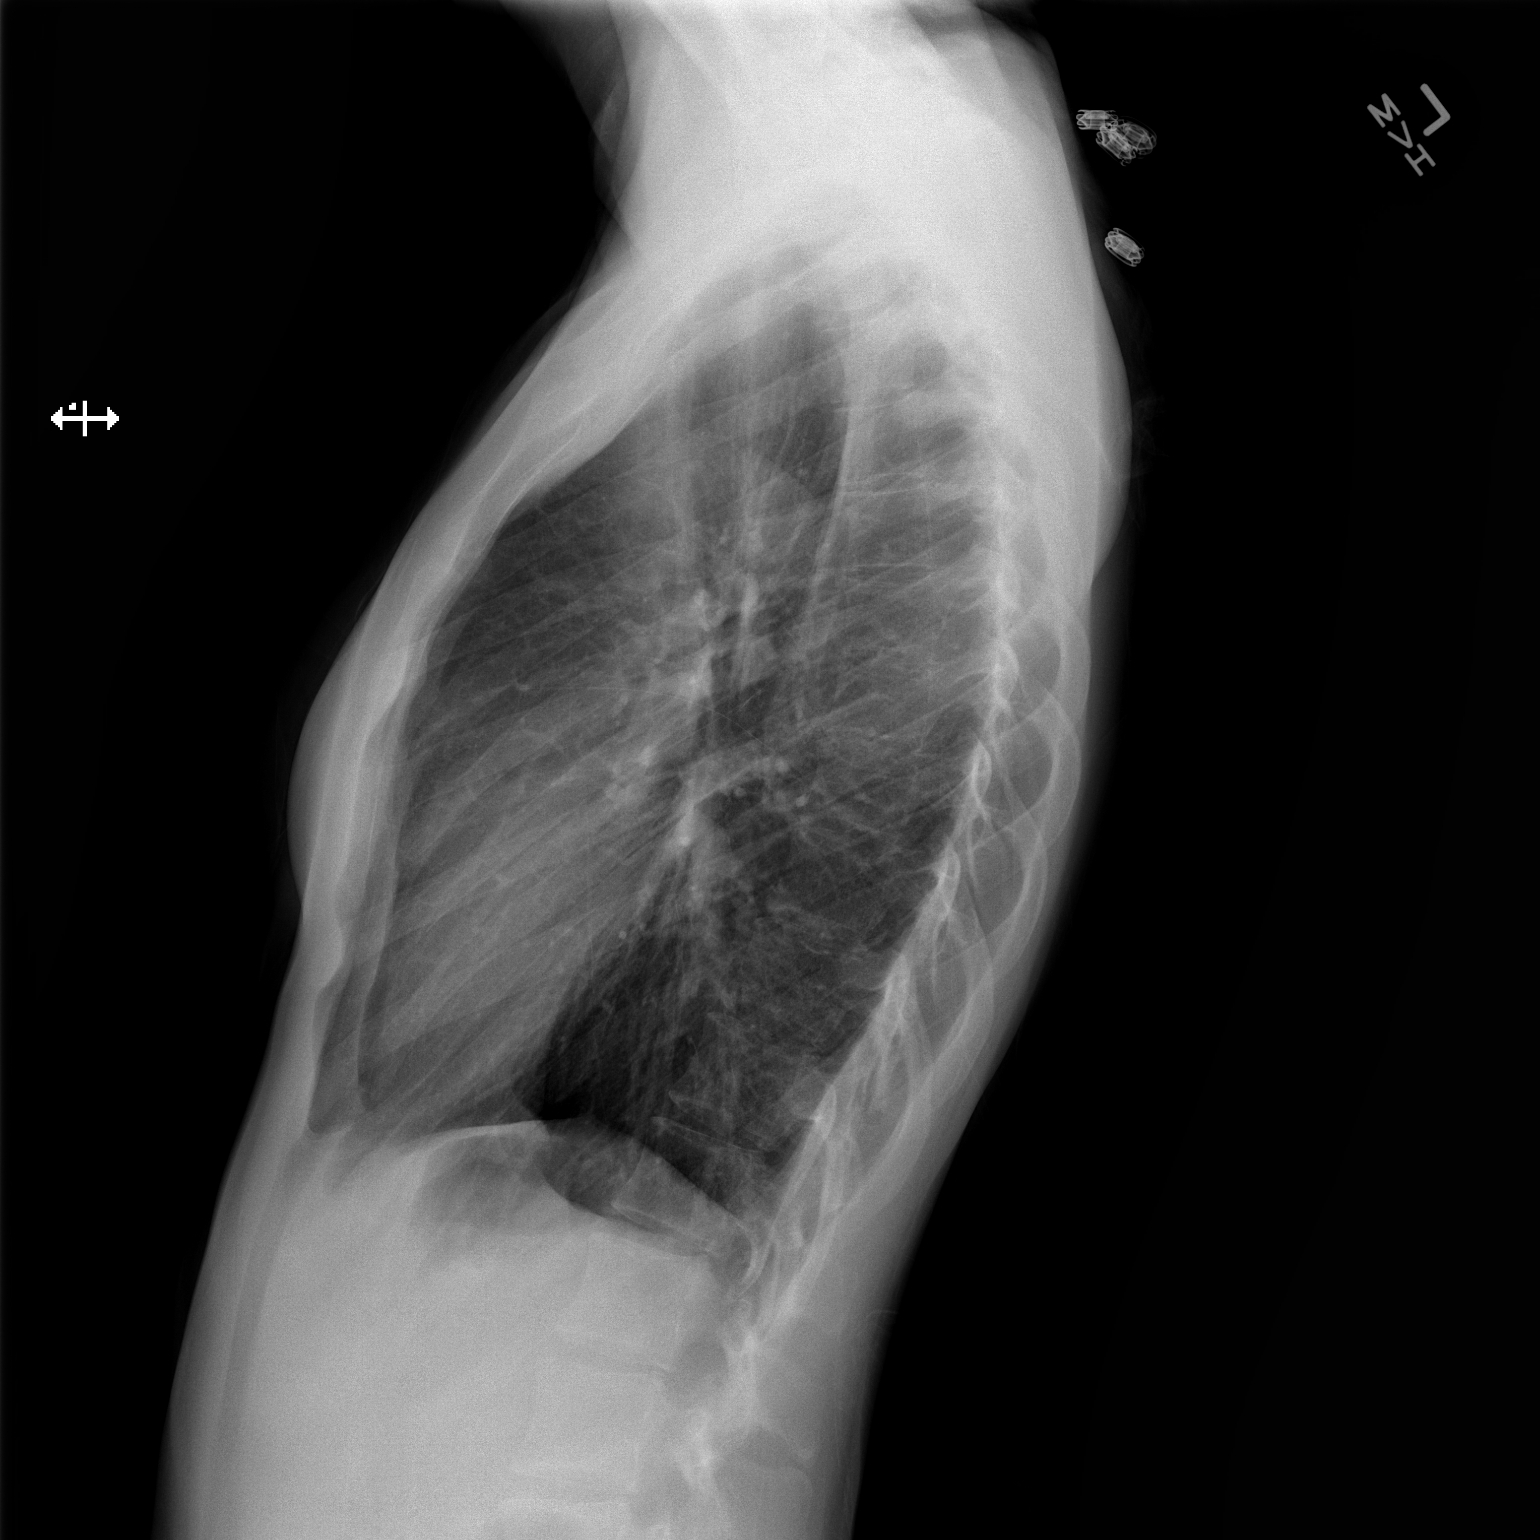

[2 of 2 positions shown; findings below may reference images not displayed]

FINDINGS: Heart and mediastinal contours are within normal limits.
No focal opacities or effusions.  No acute bony abnormality.
IMPRESSION: No active cardiopulmonary disease.

## 2015-04-17 NOTE — L&D Delivery Note (Signed)
Delivery Note At 9:35 PM a viable and healthy female was delivered via Vaginal, Spontaneous Delivery (Presentation:ROA; vtx ).  APGAR: 9, 9; weight pending .   Placenta status:spontaneous intact not sent , .  Cord:  CAN x1 and left arm with the following complications: .  Cord pH: n/a  Anesthesia:  Epidural, local Episiotomy: None Lacerations: 2nd degree;Perineal Suture Repair: 3.0 chromic Est. Blood Loss (mL): 200  Mom to postpartum.  Baby to Couplet care / Skin to Skin.  Tyrell Seifer A 02/29/2016, 10:58 PM

## 2015-08-03 LAB — OB RESULTS CONSOLE RPR: RPR: NONREACTIVE

## 2015-08-03 LAB — OB RESULTS CONSOLE HEPATITIS B SURFACE ANTIGEN: HEP B S AG: NEGATIVE

## 2015-08-03 LAB — OB RESULTS CONSOLE ABO/RH: RH TYPE: POSITIVE

## 2015-08-03 LAB — OB RESULTS CONSOLE HIV ANTIBODY (ROUTINE TESTING): HIV: NONREACTIVE

## 2015-08-03 LAB — OB RESULTS CONSOLE ANTIBODY SCREEN: ANTIBODY SCREEN: NEGATIVE

## 2015-08-03 LAB — OB RESULTS CONSOLE RUBELLA ANTIBODY, IGM: Rubella: NON-IMMUNE/NOT IMMUNE

## 2016-02-01 LAB — OB RESULTS CONSOLE GBS: STREP GROUP B AG: NEGATIVE

## 2016-02-22 ENCOUNTER — Encounter (HOSPITAL_COMMUNITY): Payer: Self-pay | Admitting: *Deleted

## 2016-02-22 ENCOUNTER — Other Ambulatory Visit: Payer: Self-pay | Admitting: Obstetrics & Gynecology

## 2016-02-22 ENCOUNTER — Telehealth (HOSPITAL_COMMUNITY): Payer: Self-pay | Admitting: *Deleted

## 2016-02-22 NOTE — Telephone Encounter (Signed)
Preadmission screen  

## 2016-02-29 ENCOUNTER — Inpatient Hospital Stay (HOSPITAL_COMMUNITY): Admitting: Anesthesiology

## 2016-02-29 ENCOUNTER — Inpatient Hospital Stay (HOSPITAL_COMMUNITY)
Admission: AD | Admit: 2016-02-29 | Discharge: 2016-03-02 | DRG: 775 | Disposition: A | Discharge status: Home / Self Care | Source: Ambulatory Visit | Attending: Obstetrics and Gynecology | Admitting: Obstetrics and Gynecology

## 2016-02-29 ENCOUNTER — Encounter (HOSPITAL_COMMUNITY): Payer: Self-pay

## 2016-02-29 DIAGNOSIS — Z3A40 40 weeks gestation of pregnancy: Secondary | ICD-10-CM

## 2016-02-29 DIAGNOSIS — O3663X0 Maternal care for excessive fetal growth, third trimester, not applicable or unspecified: Secondary | ICD-10-CM | POA: Diagnosis present

## 2016-02-29 DIAGNOSIS — O24410 Gestational diabetes mellitus in pregnancy, diet controlled: Secondary | ICD-10-CM | POA: Diagnosis present

## 2016-02-29 DIAGNOSIS — O24420 Gestational diabetes mellitus in childbirth, diet controlled: Principal | ICD-10-CM | POA: Diagnosis present

## 2016-02-29 DIAGNOSIS — R339 Retention of urine, unspecified: Secondary | ICD-10-CM | POA: Diagnosis not present

## 2016-02-29 LAB — CBC
HCT: 39.5 % (ref 36.0–46.0)
Hemoglobin: 13.7 g/dL (ref 12.0–15.0)
MCH: 30.4 pg (ref 26.0–34.0)
MCHC: 34.7 g/dL (ref 30.0–36.0)
MCV: 87.6 fL (ref 78.0–100.0)
PLATELETS: 262 10*3/uL (ref 150–400)
RBC: 4.51 MIL/uL (ref 3.87–5.11)
RDW: 14 % (ref 11.5–15.5)
WBC: 15.7 10*3/uL — ABNORMAL HIGH (ref 4.0–10.5)

## 2016-02-29 LAB — TYPE AND SCREEN
ABO/RH(D): O POS
ANTIBODY SCREEN: NEGATIVE

## 2016-02-29 LAB — ABO/RH: ABO/RH(D): O POS

## 2016-02-29 LAB — GLUCOSE, CAPILLARY: Glucose-Capillary: 92 mg/dL (ref 65–99)

## 2016-02-29 MED ORDER — DIPHENHYDRAMINE HCL 50 MG/ML IJ SOLN
12.5000 mg | INTRAMUSCULAR | Status: DC | PRN
  Administered 2016-02-29: 12.5 mg via INTRAVENOUS
  Filled 2016-02-29: qty 1

## 2016-02-29 MED ORDER — LIDOCAINE HCL (PF) 1 % IJ SOLN
INTRAMUSCULAR | Status: DC | PRN
  Administered 2016-02-29 (×2): 5 mL

## 2016-02-29 MED ORDER — ACETAMINOPHEN 325 MG PO TABS: 650.0000 mg | ORAL_TABLET | ORAL | Status: DC | PRN

## 2016-02-29 MED ORDER — TERBUTALINE SULFATE 1 MG/ML IJ SOLN
0.2500 mg | Freq: Once | INTRAMUSCULAR | Status: DC | PRN
Start: 2016-02-29 — End: 2016-03-01
  Filled 2016-02-29: qty 1

## 2016-02-29 MED ORDER — VITAMIN K1 1 MG/0.5ML IJ SOLN
INTRAMUSCULAR | Status: AC
Start: 2016-02-29 — End: 2016-03-01
  Filled 2016-02-29: qty 0.5

## 2016-02-29 MED ORDER — PHENYLEPHRINE 40 MCG/ML (10ML) SYRINGE FOR IV PUSH (FOR BLOOD PRESSURE SUPPORT)
80.0000 ug | PREFILLED_SYRINGE | INTRAVENOUS | Status: DC | PRN
  Filled 2016-02-29: qty 10
  Filled 2016-02-29: qty 5

## 2016-02-29 MED ORDER — FENTANYL 2.5 MCG/ML BUPIVACAINE 1/10 % EPIDURAL INFUSION (WH - ANES)
14.0000 mL/h | INTRAMUSCULAR | Status: DC | PRN
  Administered 2016-02-29: 14 mL/h via EPIDURAL
  Filled 2016-02-29: qty 100

## 2016-02-29 MED ORDER — OXYCODONE-ACETAMINOPHEN 5-325 MG PO TABS: 2.0000 | ORAL_TABLET | ORAL | Status: DC | PRN

## 2016-02-29 MED ORDER — OXYTOCIN BOLUS FROM INFUSION
500.0000 mL | Freq: Once | INTRAVENOUS | Status: AC
  Administered 2016-02-29: 500 mL via INTRAVENOUS

## 2016-02-29 MED ORDER — OXYTOCIN 40 UNITS IN LACTATED RINGERS INFUSION - SIMPLE MED: 1.0000 m[IU]/min | INTRAVENOUS | Status: DC

## 2016-02-29 MED ORDER — LACTATED RINGERS IV SOLN: 500.0000 mL | INTRAVENOUS | Status: DC | PRN

## 2016-02-29 MED ORDER — ONDANSETRON HCL 4 MG/2ML IJ SOLN: 4.0000 mg | Freq: Four times a day (QID) | INTRAMUSCULAR | Status: DC | PRN

## 2016-02-29 MED ORDER — EPHEDRINE 5 MG/ML INJ
10.0000 mg | INTRAVENOUS | Status: DC | PRN
  Filled 2016-02-29: qty 4

## 2016-02-29 MED ORDER — FLEET ENEMA 7-19 GM/118ML RE ENEM: 1.0000 | ENEMA | RECTAL | Status: DC | PRN

## 2016-02-29 MED ORDER — LIDOCAINE HCL (PF) 1 % IJ SOLN
30.0000 mL | INTRAMUSCULAR | Status: DC | PRN
  Administered 2016-02-29: 30 mL via SUBCUTANEOUS
  Filled 2016-02-29: qty 30

## 2016-02-29 MED ORDER — LACTATED RINGERS IV SOLN
INTRAVENOUS | Status: DC
  Administered 2016-02-29: 16:00:00 via INTRAVENOUS

## 2016-02-29 MED ORDER — ACETAMINOPHEN 325 MG PO TABS
650.0000 mg | ORAL_TABLET | ORAL | Status: DC | PRN
  Administered 2016-03-01 (×2): 650 mg via ORAL
  Filled 2016-02-29 (×2): qty 2

## 2016-02-29 MED ORDER — OXYTOCIN 40 UNITS IN LACTATED RINGERS INFUSION - SIMPLE MED
2.5000 [IU]/h | INTRAVENOUS | Status: DC
  Administered 2016-02-29: 2.5 [IU]/h via INTRAVENOUS
  Filled 2016-02-29: qty 1000

## 2016-02-29 MED ORDER — SOD CITRATE-CITRIC ACID 500-334 MG/5ML PO SOLN: 30.0000 mL | ORAL | Status: DC | PRN

## 2016-02-29 MED ORDER — LACTATED RINGERS IV SOLN: 500.0000 mL | Freq: Once | INTRAVENOUS | Status: DC

## 2016-02-29 MED ORDER — OXYCODONE-ACETAMINOPHEN 5-325 MG PO TABS
1.0000 | ORAL_TABLET | ORAL | Status: DC | PRN
Start: 2016-02-29 — End: 2016-03-01

## 2016-02-29 NOTE — Anesthesia Procedure Notes (Signed)
Epidural Patient location during procedure: OB  Staffing Anesthesiologist: Phillips GroutARIGNAN, Tikesha Mort Performed: anesthesiologist   Preanesthetic Checklist Completed: patient identified, site marked, surgical consent, pre-op evaluation, timeout performed, IV checked, risks and benefits discussed and monitors and equipment checked  Epidural Patient position: sitting Prep: DuraPrep Patient monitoring: heart rate, continuous pulse ox and blood pressure Approach: midline Location: L3-L4 Injection technique: LOR saline  Needle:  Needle type: Tuohy  Needle gauge: 17 G Needle length: 9 cm and 9 Needle insertion depth: 4 cm Catheter type: closed end flexible Catheter size: 20 Guage Catheter at skin depth: 8 cm Test dose: negative  Assessment Events: blood not aspirated, injection not painful, no injection resistance, negative IV test and no paresthesia  Additional Notes Patient identified. Risks/Benefits/Options discussed with patient including but not limited to bleeding, infection, nerve damage, paralysis, failed block, incomplete pain control, headache, blood pressure changes, nausea, vomiting, reactions to medication both or allergic, itching and postpartum back pain. Confirmed with bedside nurse the patient's most recent platelet count. Confirmed with patient that they are not currently taking any anticoagulation, have any bleeding history or any family history of bleeding disorders. Patient expressed understanding and wished to proceed. All questions were answered. Sterile technique was used throughout the entire procedure. Please see nursing notes for vital signs. Test dose was given through epidural needle and negative prior to continuing to dose epidural or start infusion. Warning signs of high block given to the patient including shortness of breath, tingling/numbness in hands, complete motor block, or any concerning symptoms with instructions to call for help. Patient was given instructions  on fall risk and not to get out of bed. All questions and concerns addressed with instructions to call with any issues.

## 2016-02-29 NOTE — MAU Note (Signed)
Notified provider that patient is here for a labor eval. Patient is 4cm. Ctx every 2-3 mins. Fetus active. GBS -. Provider said to admit patient to l&d.

## 2016-02-29 NOTE — Progress Notes (Signed)
S: notes rectal/vaginal pressure Has epidural Progressing well SROM  O: BP 112/73   Pulse 87   Temp 97.6 F (36.4 C) (Oral)   Resp 20   Ht 5\' 4"  (1.626 m)   Wt 63 kg (139 lb)   SpO2 100%   BMI 23.86 kg/m  VE fully/100/0 asynclitic  Tracing: baseline 135 (+) accel to 160 Ctx q 2mins  IMP: rapid progression of labor Term gestation Class A1 GDM P) right exaggerated sims. Labor vtx done. Check BS

## 2016-02-29 NOTE — H&P (Signed)
Martha Dayna BarkerGabriela Gutierrez is a 25 y.o. female presenting for in labor. Intact membrane PNC complicated by Class A1 GDM diet controlled however last sono@ 34 2/7 weeks was 6lb 9 oz( 95%) AC( 99%) OB History    Gravida Para Term Preterm AB Living   1             SAB TAB Ectopic Multiple Live Births                 History reviewed. No pertinent past medical history. Past Surgical History:  Procedure Laterality Date  . APPENDECTOMY    . LAPAROSCOPIC APPENDECTOMY  08/18/2011   Procedure: APPENDECTOMY LAPAROSCOPIC;  Surgeon: Emelia LoronMatthew Wakefield, MD;  Location: [facility];  Service: General;  Laterality: N/A;  . None     Family History: family history includes Cancer in her mother. Social History:  reports that she has never smoked. She has never used smokeless tobacco. She reports that she drinks alcohol. She reports that she does not use drugs.     Maternal Diabetes: Yes:  Diabetes Type:  Diet controlled Genetic Screening: Normal Maternal Ultrasounds/Referrals: Normal Fetal Ultrasounds or other Referrals:  None Maternal Substance Abuse:  No Significant Maternal Medications:  None Significant Maternal Lab Results:  Lab values include: Group B Strep negative Other Comments:  LGA baby @ 34 weeks  Review of Systems  All other systems reviewed and are negative.  History Dilation: 10 Effacement (%): 100 Station: 0 Exam by:: Dr. Cherly Hensenousins Blood pressure 112/73, pulse 87, temperature 97.6 F (36.4 C), temperature source Oral, resp. rate 20, height 5\' 4"  (1.626 m), weight 63 kg (139 lb), SpO2 100 %. Exam Physical Exam  Constitutional: She is oriented to person, place, and time. She appears well-developed and well-nourished.  HENT:  Head: Atraumatic.  Eyes: EOM are normal.  Neck: Neck supple.  Cardiovascular: Regular rhythm.   Respiratory: Breath sounds normal.  GI: Soft.  Musculoskeletal: She exhibits edema.  Neurological: She is alert and oriented to person, place, and time.  Skin: Skin is  warm and dry.    Prenatal labs: ABO, Rh: --/--/O POS (11/15 1612) Antibody: NEG (11/15 1612) Rubella: Nonimmune (04/19 0000) RPR: Nonreactive (04/19 0000)  HBsAg: Negative (04/19 0000)  HIV: Non-reactive (04/19 0000)  GBS: Negative (10/18 0000)   Assessment/Plan: Active phase Class a1 GDM  Term gestation Possible LGA baby P) admit routine labs. Epidural. BS q 4 hrs  Amniotomy prn  Sharilyn Geisinger A 02/29/2016, 6:07 PM

## 2016-02-29 NOTE — Anesthesia Preprocedure Evaluation (Signed)

## 2016-02-29 NOTE — MAU Note (Signed)
Patient presents with c/o ctx 2-3 mins apart. Patient denies any bleeding LOF. Fetus active.

## 2016-03-01 ENCOUNTER — Inpatient Hospital Stay (HOSPITAL_COMMUNITY): Admission: RE | Admit: 2016-03-01 | Source: Ambulatory Visit

## 2016-03-01 DIAGNOSIS — O24410 Gestational diabetes mellitus in pregnancy, diet controlled: Secondary | ICD-10-CM | POA: Diagnosis present

## 2016-03-01 LAB — RPR: RPR Ser Ql: NONREACTIVE

## 2016-03-01 MED ORDER — PRENATAL MULTIVITAMIN CH
1.0000 | ORAL_TABLET | Freq: Every day | ORAL | Status: DC
  Administered 2016-03-01 – 2016-03-02 (×2): 1 via ORAL
  Filled 2016-03-01 (×2): qty 1

## 2016-03-01 MED ORDER — BENZOCAINE-MENTHOL 20-0.5 % EX AERO
1.0000 "application " | INHALATION_SPRAY | CUTANEOUS | Status: DC | PRN
  Administered 2016-03-01: 1 via TOPICAL
  Filled 2016-03-01: qty 56

## 2016-03-01 MED ORDER — SIMETHICONE 80 MG PO CHEW: 80.0000 mg | CHEWABLE_TABLET | ORAL | Status: DC | PRN

## 2016-03-01 MED ORDER — ONDANSETRON HCL 4 MG PO TABS: 4.0000 mg | ORAL_TABLET | ORAL | Status: DC | PRN

## 2016-03-01 MED ORDER — COCONUT OIL OIL
1.0000 "application " | TOPICAL_OIL | Status: DC | PRN
  Administered 2016-03-01: 1 via TOPICAL
  Filled 2016-03-01: qty 120

## 2016-03-01 MED ORDER — SENNOSIDES-DOCUSATE SODIUM 8.6-50 MG PO TABS
2.0000 | ORAL_TABLET | ORAL | Status: DC
  Administered 2016-03-01 – 2016-03-02 (×2): 2 via ORAL
  Filled 2016-03-01 (×2): qty 2

## 2016-03-01 MED ORDER — FERROUS SULFATE 325 (65 FE) MG PO TABS
325.0000 mg | ORAL_TABLET | Freq: Two times a day (BID) | ORAL | Status: DC
  Administered 2016-03-01 – 2016-03-02 (×3): 325 mg via ORAL
  Filled 2016-03-01 (×3): qty 1

## 2016-03-01 MED ORDER — WITCH HAZEL-GLYCERIN EX PADS: 1.0000 "application " | MEDICATED_PAD | CUTANEOUS | Status: DC | PRN

## 2016-03-01 MED ORDER — IBUPROFEN 600 MG PO TABS
600.0000 mg | ORAL_TABLET | Freq: Four times a day (QID) | ORAL | Status: DC
  Administered 2016-03-01 – 2016-03-02 (×6): 600 mg via ORAL
  Filled 2016-03-01 (×6): qty 1

## 2016-03-01 MED ORDER — DIPHENHYDRAMINE HCL 25 MG PO CAPS: 25.0000 mg | ORAL_CAPSULE | Freq: Four times a day (QID) | ORAL | Status: DC | PRN

## 2016-03-01 MED ORDER — DIBUCAINE 1 % RE OINT: 1.0000 "application " | TOPICAL_OINTMENT | RECTAL | Status: DC | PRN

## 2016-03-01 MED ORDER — ZOLPIDEM TARTRATE 5 MG PO TABS: 5.0000 mg | ORAL_TABLET | Freq: Every evening | ORAL | Status: DC | PRN

## 2016-03-01 MED ORDER — ONDANSETRON HCL 4 MG/2ML IJ SOLN: 4.0000 mg | INTRAMUSCULAR | Status: DC | PRN

## 2016-03-01 NOTE — Lactation Note (Signed)
This note was copied from a baby's chart. Lactation Consultation Note New mom has dx: of PCOS, GDM. Mom has round breast w/ good glandular tissue. Nipples and areolas full feeling. Feels as if may have edema. Nipples w/short shaft. Breast not compressible. Hand expression w/colostrum noted. Mom states baby BF well. LC concerned that baby wouldn't be able to obtain a deep latch d/t breast not compressible. Shells given to wear in am to evert nipple and relieve edema in areola. Hand pump given to pre-pump to evert nipple before latching.  Mom encouraged to feed baby 8-12 times/24 hours and with feeding cues. Referred to Baby and Me Book in Breastfeeding section Pg. 22-23 for position options and Proper latch demonstration. Educated about newborn behavior, STS, I&O, cluster feeding, supply and demand. WH/LC brochure given w/resources, support groups and LC services. Patient Name: Martha Gutierrez ZOXWR'UToday's Date: 03/01/2016 Reason for consult: Initial assessment   Maternal Data Has patient been taught Hand Expression?: Yes Does the patient have breastfeeding experience prior to this delivery?: No  Feeding Length of feed: 20 min  LATCH Score/Interventions       Type of Nipple: Everted at rest and after stimulation (very short shaft)  Comfort (Breast/Nipple): Soft / non-tender           Lactation Tools Discussed/Used Tools: Shells;Pump Shell Type: Inverted Breast pump type: Manual WIC Program: No Pump Review: Setup, frequency, and cleaning;Milk Storage Initiated by:: Peri JeffersonL. Sanna Porcaro RN IBCLC Date initiated:: 03/01/16   Consult Status Consult Status: Follow-up Date: 03/01/16 Follow-up type: In-patient    Martha Gutierrez, Diamond NickelLAURA G 03/01/2016, 2:54 AM

## 2016-03-01 NOTE — Lactation Note (Signed)
This note was copied from a baby's chart. Lactation Consultation Note  Baby latched on L breast upon entering.  Lips flanged.  Sucks and swallows observed. When baby unlatched noted small blister forming on tip of nipple.  R nipple has small abrasion. Mother's nipples were initially inverted and now everted skin is tender. Suggest wearing shells with bra for comfort with coconut oil or ebm. Recommend if mother is too sore to latch on R side, pump w/ manual pump and retry latching the next feeding. Discussed cluster feeding and supply and demand.  Pacifier use not recommended at this time.  Did not do oral assessment on infant at this consult.   Patient Name: Girl Lorayne Marekna Amy AOZHY'QToday's Date: 03/01/2016 Reason for consult: Follow-up assessment   Maternal Data    Feeding Feeding Type: Breast Fed  LATCH Score/Interventions Latch: Grasps breast easily, tongue down, lips flanged, rhythmical sucking. Intervention(s): Breast massage  Audible Swallowing: Spontaneous and intermittent Intervention(s): Alternate breast massage  Type of Nipple: Everted at rest and after stimulation  Comfort (Breast/Nipple): Filling, red/small blisters or bruises, mild/mod discomfort  Problem noted: Cracked, bleeding, blisters, bruises  Hold (Positioning): Assistance needed to correctly position infant at breast and maintain latch.  LATCH Score: 8  Lactation Tools Discussed/Used Tools: Pump;Shells;Comfort gels;Other (comment) (coconut oil) Breast pump type: Manual   Consult Status Consult Status: Follow-up Date: 03/02/16 Follow-up type: In-patient    Dahlia ByesBerkelhammer, Chasidy Janak [facility] 03/01/2016, 9:36 PM

## 2016-03-01 NOTE — Lactation Note (Signed)
This note was copied from a baby's chart. Lactation Consultation Note:mother states that breastfeeding is going better. She states that her (R) nipple was cracked but feeling better . Mother was given comfort gels and advised to ask nurse for coconut oil. Mother states she has been breastfeeding on the (L) side today to give (R) a break. advsised mother to page to have LC to check latch. Mother advised to rotate positions as this will help soreness. Mother receptive to teaching.  Patient Name: Martha Gutierrez ZOXWR'UToday's Date: 03/01/2016     Maternal Data    Feeding Feeding Type: Breast Fed Length of feed: 20 min (per mother)  LATCH Score/Interventions                      Lactation Tools Discussed/Used     Consult Status Consult Status: Follow-up    Stevan BornKendrick, Martha Gutierrez 03/01/2016, 2:47 PM

## 2016-03-01 NOTE — Anesthesia Postprocedure Evaluation (Signed)
Anesthesia Post Note  Patient: Martha Gutierrez  Procedure(s) Performed: * No procedures listed *  Patient location during evaluation: Mother Baby Anesthesia Type: Epidural Level of consciousness: awake Pain management: satisfactory to patient Vital Signs Assessment: post-procedure vital signs reviewed and stable Respiratory status: spontaneous breathing Cardiovascular status: stable Anesthetic complications: no     Last Vitals:  Vitals:   03/01/16 0133 03/01/16 0530  BP: (!) 99/59 97/63  Pulse: 82 87  Resp: 18 16  Temp: 36.8 C 36.7 C    Last Pain:  Vitals:   03/01/16 0133  TempSrc:   PainSc: 0-No pain   Pain Goal: Patients Stated Pain Goal: 4 (02/29/16 1638)               Cephus ShellingBURGER,Kameron Glazebrook

## 2016-03-01 NOTE — Progress Notes (Signed)
PPD # 1 SVD Information for the patient's newborn:  Martha Gutierrez, Martha Gutierrez [[id number]][[id number]]  female    breast feeding, shallow latch and nipple pain Baby name: Sophia  S:  Reports feeling OK, bonding well with baby             Tolerating po/ No nausea or vomiting             Bleeding is decreasing             Pain controlled with naproxen (OTC), some cramping             Up ad lib / ambulatory / voiding with difficulty, partially emptying       O:  A & O x 3, in no apparent distress              VS:  Vitals:   03/01/16 0000 03/01/16 0133 03/01/16 0530 03/01/16 0947  BP: 98/64 (!) 99/59 97/63 108/72  Pulse: 87 82 87 80  Resp: 18 18 16 18   Temp: 98.4 F (36.9 C) 98.2 F (36.8 C) 98.1 F (36.7 C) 98.2 F (36.8 C)  TempSrc:    Axillary  SpO2:    100%  Weight:      Height:        LABS:  Recent Labs  02/29/16 1612  WBC 15.7*  HGB 13.7  HCT 39.5  PLT 262    Blood type: --/--/O POS, O POS (11/15 1612)  Rubella: Nonimmune (04/19 0000)   I&O: I/O last 3 completed shifts: In: -  Out: 1000 [Urine:800; Blood:200]          No intake/output data recorded.  Lungs: Clear and unlabored  Heart: regular rate and rhythm / no murmurs  Abdomen: soft, non-tender, non-distended             Fundus: firm, non-tender, U+1, displaced to R  Perineum: mild edema, repair intact  Lochia: small  Extremities: no edema, no calf pain or tenderness    A/P: PPD # 1 25 y.o., G1P1001   Principal Problem:   Postpartum care following vaginal delivery (11/15) Active Problems:   SVD (spontaneous vaginal delivery)   Second-degree perineal laceration, with delivery   Gestational diabetes mellitus, class A1   Doing well - stable status  Urinary retention, will attempt using position changes and peppermint oil in toiled, straight cath if still unable to void  Routine post partum orders  Anticipate discharge tomorrow    Neta Mendsaniela C Desirre Eickhoff, MSN, CNM 03/01/2016, 10:08 AM

## 2016-03-02 LAB — CBC
HEMATOCRIT: 35.2 % — AB (ref 36.0–46.0)
HEMOGLOBIN: 12.3 g/dL (ref 12.0–15.0)
MCH: 30.8 pg (ref 26.0–34.0)
MCHC: 34.9 g/dL (ref 30.0–36.0)
MCV: 88 fL (ref 78.0–100.0)
Platelets: 229 10*3/uL (ref 150–400)
RBC: 4 MIL/uL (ref 3.87–5.11)
RDW: 14.6 % (ref 11.5–15.5)
WBC: 14.3 10*3/uL — ABNORMAL HIGH (ref 4.0–10.5)

## 2016-03-02 MED ORDER — IBUPROFEN 600 MG PO TABS: 600.0000 mg | ORAL_TABLET | Freq: Four times a day (QID) | ORAL | 0 refills | Status: DC

## 2016-03-02 MED ORDER — MEASLES, MUMPS & RUBELLA VAC ~~LOC~~ INJ
0.5000 mL | INJECTION | Freq: Once | SUBCUTANEOUS | Status: AC
  Administered 2016-03-02: 0.5 mL via SUBCUTANEOUS
  Filled 2016-03-02: qty 0.5

## 2016-03-02 NOTE — Lactation Note (Signed)
This note was copied from a baby's chart. Lactation Consultation Note Mom engorged DEBP started to relieve engorgement. Mom's Rt. Areola and nipple very edematous. Reverse pressure slight helpful but not much. ICE to breast. Knots massaged. Encouraged after pumping to lay flat w/arms over head, massage upwards to breast. Not much coming from pumping, to full. Rt. Axillary has slight enlarged area under armpit, tender. Mom has bulbous areolas and nipples, cracked, tender. Fitted w/#20 NS> encouraged to wear shells. Has comfort gels.  Massaged moms breast to assist in relieveing engorgement. Baby BF great w/NS, noted softening of breast. Patient Name: Martha Gutierrez JYNWG'NToday's Date: 03/02/2016 Reason for consult: Follow-up assessment;Breast/nipple pain   Maternal Data    Feeding Feeding Type: Breast Fed Length of feed: 20 min  LATCH Score/Interventions Latch: Grasps breast easily, tongue down, lips flanged, rhythmical sucking. Intervention(s): Adjust position;Assist with latch;Breast massage;Breast compression  Audible Swallowing: Spontaneous and intermittent Intervention(s): Skin to skin;Hand expression;Alternate breast massage  Type of Nipple: Everted at rest and after stimulation  Comfort (Breast/Nipple): Engorged, cracked, bleeding, large blisters, severe discomfort Problem noted: Engorgment;Cracked, bleeding, blisters, bruises Intervention(s): Reverse pressure;Hand expression;Ice Intervention(s): Expressed breast milk to nipple;Double electric pump  Problem noted: Cracked, bleeding, blisters, bruises;Mild/Moderate discomfort Interventions  (Cracked/bleeding/bruising/blister): Double electric pump;Expressed breast milk to nipple;Reverse pressure Interventions (Mild/moderate discomfort): Hand massage;Hand expression;Reverse pressue;Comfort gels;Breast shields;Post-pump  Hold (Positioning): Assistance needed to correctly position infant at breast and maintain latch. Intervention(s):  Breastfeeding basics reviewed;Support Pillows;Position options;Skin to skin  LATCH Score: 7  Lactation Tools Discussed/Used Tools: Shells;Nipple Shields;Pump;Comfort gels;Feeding cup Nipple shield size: 20 Shell Type: Inverted Breast pump type: Double-Electric Breast Pump Pump Review: Setup, frequency, and cleaning;Milk Storage Date initiated:: 03/02/16   Consult Status Consult Status: Follow-up Date: 03/02/16 Follow-up type: In-patient    Charyl DancerCARVER, [name] 03/02/2016, 7:18 AM

## 2016-03-02 NOTE — Progress Notes (Signed)
Discharge education complete. Discharge instructions and follow up appointment discussed. Patient verbalized understanding. 

## 2016-03-02 NOTE — Lactation Note (Signed)
This note was copied from a baby's chart. Lactation Consultation Note  Patient Name: Martha Lorayne Marekna Bowditch ONGEX'BToday's Date: 03/02/2016 Reason for consult: Follow-up assessment     Pediatric NP Leighton RuffGenevieve Mack notified that after [facility] observation there is questionable transfer.  LC plan was relayed to NP to have mom feed with pre and post weight along with waiting to see another void from baby.  LC will notify NP later in the day with results.  Possibility of baby becoming a baby patient for further assistance with feeds.   Maternal Data       Martha Gutierrez [facility] 03/02/2016, 12:03 PM

## 2016-03-02 NOTE — Progress Notes (Signed)
Patient ID: Martha MarchAna Gabriela Gutierrez, female   DOB: 07-05-1990, 25 y.o.   MRN: [id number] Post Partum Day #2            Information for the patient's newborn:  Martha HavenKetrow, Martha Gutierrez [[id number]][[id number]]  female   Feeding: breast / having some difficulty with baby staying on during latch  Subjective: No HA, SOB, CP, F/C, breast symptoms. Pain well-managed with ibuprofen. Normal vaginal bleeding, no clots.      Objective:  Temp:  [97.9 F (36.6 C)-98.3 F (36.8 C)] 97.9 F (36.6 C) (11/17 0654) Pulse Rate:  [79-88] 79 (11/17 0654) Resp:  [16-18] 16 (11/17 0654) BP: (108-114)/(67-74) 114/74 (11/17 0654) SpO2:  [100 %] 100 % (11/16 0947)  No intake or output data in the 24 hours ending 03/02/16 0927     Recent Labs  02/29/16 1612 03/02/16 0544  WBC 15.7* 14.3*  HGB 13.7 12.3  HCT 39.5 35.2*  PLT 262 229    Blood type: O POS (11/15 1612) Rubella: Nonimmune (04/19 0000)    Physical Exam:  General: alert, cooperative and no distress Uterine Fundus: firm, midline, U-1 Lochia: appropriate Perineum: 2nd degree perineal repair healing well, edema none DVT Evaluation: No evidence of DVT seen on physical exam. Negative Homan's sign. No cords or calf tenderness. No significant calf/ankle edema.    Assessment/Plan: PPD # 2 / 25 y.o., G1P1001 S/P: spontaneous vaginal   Principal Problem:   Postpartum care following vaginal delivery (11/15) Active Problems:   SVD (spontaneous vaginal delivery)   Second-degree perineal laceration, with delivery   Gestational diabetes mellitus, class A1   normal postpartum exam  Continue current postpartum care  D/C home   LOS: 2 days   Raelyn MoraAWSON, Camelia Stelzner, M, MSN, CNM 03/02/2016, 9:27 AM

## 2016-03-02 NOTE — Discharge Summary (Signed)
OB Discharge Summary     Patient Name: Martha Gutierrez DOB: 02-24-1991 MRN: [id number]  Date of admission: 02/29/2016 Delivering MD: COUSINS, SHERONETTE   Date of discharge: 03/02/2016  Admitting diagnosis: 40w ctx 2-3 min apart, possible labor Intrauterine pregnancy: 2615w3d     Secondary diagnosis:  Principal Problem:   Postpartum care following vaginal delivery (11/15) Active Problems:   SVD (spontaneous vaginal delivery)   Second-degree perineal laceration, with delivery   Gestational diabetes mellitus, class A1  Additional problems: none     Discharge diagnosis: Term Pregnancy Delivered and GDM A1                                                                                                Post partum procedures:none  Augmentation: none  Complications: None  Hospital course:  Onset of Labor With Vaginal Delivery     25 y.o. yo G1P1001 at 6515w3d was admitted in Active Labor on 02/29/2016. Patient had an uncomplicated labor course as follows:  Membrane Rupture Time/Date: 5:05 PM ,02/29/2016   Intrapartum Procedures: Episiotomy: None [1]                                         Lacerations:  2nd degree [3];Perineal [11]  Patient had a delivery of a Viable infant. 02/29/2016  Information for the patient's newborn:  Leone HavenKetrow, Girl Chancie [[id number]][[id number]]  Delivery Method: Vaginal, Spontaneous Delivery (Filed from Delivery Summary)    Pateint had an uncomplicated postpartum course.  She is ambulating, tolerating a regular diet, passing flatus, and urinating well. Patient is discharged home in stable condition on 03/02/16.    Physical exam Vitals:   03/01/16 0530 03/01/16 0947 03/01/16 1725 03/02/16 0654  BP: 97/63 108/72 110/67 114/74  Pulse: 87 80 88 79  Resp: 16 18 18 16   Temp: 98.1 F (36.7 C) 98.2 F (36.8 C) 98.3 F (36.8 C) 97.9 F (36.6 C)  TempSrc:  Axillary Oral Oral  SpO2:  100%    Weight:      Height:       General: alert, cooperative and no  distress Lochia: appropriate Uterine Fundus: firm, midline, U-1  DVT Evaluation: No evidence of DVT seen on physical exam. Negative Homan's sign. No cords or calf tenderness. No significant calf/ankle edema. Labs: Lab Results  Component Value Date   WBC 14.3 (H) 03/02/2016   HGB 12.3 03/02/2016   HCT 35.2 (L) 03/02/2016   MCV 88.0 03/02/2016   PLT 229 03/02/2016   CMP Latest Ref Rng & Units 08/18/2011  Glucose 70 - 99 mg/dL 84  BUN 6 - 23 mg/dL 9  Creatinine 7.820.50 - 9.561.10 mg/dL 2.130.65  Sodium 086135 - 578145 mEq/L 135  Potassium 3.5 - 5.1 mEq/L 3.4(L)  Chloride 96 - 112 mEq/L 100  CO2 19 - 32 mEq/L 25  Calcium 8.4 - 10.5 mg/dL 8.9  Total Protein 6.0 - 8.3 g/dL 7.1  Total Bilirubin 0.3 - 1.2 mg/dL 0.3  Alkaline Phos 39 - 117 U/L 52  AST 0 - 37 U/L 13  ALT 0 - 35 U/L 10    Discharge instruction: per After Visit Summary and "Baby and Me Booklet".  After visit meds:    Medication List    STOP taking these medications   acetaminophen 500 MG tablet Commonly known as:  TYLENOL     TAKE these medications   ibuprofen 600 MG tablet Commonly known as:  ADVIL,MOTRIN Take 1 tablet (600 mg total) by mouth every 6 (six) hours.   prenatal multivitamin Tabs tablet Take 1 tablet by mouth daily at 12 noon.       Diet: routine diet  Activity: Advance as tolerated. Pelvic rest for 6 weeks.   Outpatient follow up:6 weeks Follow up Appt:No future appointments. Follow up Visit:No Follow-up on file.  Postpartum contraception: Undecided  Newborn Data: Live born female on 02/29/2016 Birth Weight: 8 lb 2.7 oz (3705 g) APGAR: 9, 9  Baby Feeding: Breast Disposition:home with mother   03/02/2016 Raelyn MoraAWSON, Laban Orourke, Judie PetitM, CNM

## 2016-03-02 NOTE — Lactation Note (Signed)
This note was copied from a baby's chart. Lactation Consultation Note  Patient Name: Martha Gutierrez ZOXWR'UToday's Date: 03/02/2016 Reason for consult: Follow-up assessment   With this mom and term baby, now 3236 hours old. Mom's milk is transitioning in, and was able to express 10 ml's , which was bottle fed to the baby. The baby did well with the bottle, and wet burped after. Mom then was able to latch baby to her breast, in cross cradle hold, without nipple shield,  and when I showed mom how to position her and baby,  to not make a "breathing space" by her nipple, and to use cross cradle hold, the baby latched deeply, with strong suckles, visible swallows, and mom reports no discomfort, and nipple appeared WNL, no pinching, after feeding.  I will do a pre and post  weight with baby's next latch, and if baby is transferring, and has a good wet diaper, parents will probably be able to take baby home. See above noet for more information. On exam of baby's mouth, she does have an upper lip frenulum that extends to the gum line, but baby flanges well. Tongue appears to be somewhat restricted -  Posterior frenulum imbedded in thick tissue, causing a triangle shape under tongue with elevation, and tongue tip become pointed with elevation. The baby can extend her tongue over her gum line, so not clear at this time if Breastfeeding will be effected. Mom's sore nipples may be from shallow latches.    Maternal Data    Feeding Feeding Type: Bottle Fed - Breast Milk Nipple Type: Slow - flow Length of feed: 20 min  LATCH Score/Interventions Latch: Grasps breast easily, tongue down, lips flanged, rhythmical sucking. Intervention(s): Adjust position;Assist with latch;Breast massage;Breast compression  Audible Swallowing: Spontaneous and intermittent (visual, no audible, mouth wet, clostrum seen on nipple af ter feeding) Intervention(s): Skin to skin;Hand expression;Alternate breast massage  Type of Nipple:  Everted at rest and after stimulation  Comfort (Breast/Nipple): Filling, red/small blisters or bruises, mild/mod discomfort Problem noted: Cracked, bleeding, blisters, bruises Intervention(s): Ice;Hand expression;Reverse pressure Intervention(s):  (coconut oil)  Problem noted: Mild/Moderate discomfort Interventions  (Cracked/bleeding/bruising/blister): Double electric pump Interventions (Mild/moderate discomfort): Hand massage;Hand expression;Post-pump;Comfort gels  Hold (Positioning): Assistance needed to correctly position infant at breast and maintain latch. Intervention(s): Breastfeeding basics reviewed;Support Pillows;Position options;Skin to skin  LATCH Score: 8  Lactation Tools Discussed/Used Tools: Nipple Dorris CarnesShields;Flanges (decreased to 21  flanges) Nipple shield size: 24 Shell Type: Sore Breast pump type: Double-Electric Breast Pump   Consult Status Consult Status: Follow-up Date: 03/02/16 Follow-up type: In-patient    Alfred LevinsLee, Braidyn Peace Anne 03/02/2016, 11:43 AM

## 2016-03-02 NOTE — Lactation Note (Signed)
This note was copied from a baby's chart. Lactation Consultation Note  Patient Name: Martha Gutierrez ZOXWR'UToday's Date: 03/02/2016 Reason for consult: Follow-up assessment    With this mom of  A term baby, now 2839 hours old. I did a pre and post weight on the baby, and she transferred 5 ml's in a ten minute feeding. Om latched to her right breast, which is tender, but denies any clamping or biting, and nipple WNL after feeding. The baby is passing flatus and multiple small brown pasty stools, and had a very large void, that filled her whole diaper. [name]  LC, called [place] [facility] NNP, and with above information, NNP has discharged the baby to home, with a follow up weight check for tomorrow, instead of Sunday. Mom and dad very receptive to teaching, and pleased to see [name] doing well. Mom knows to pump to comfort, if needed, and supplement baby with EBm if baby not satiated after feeding. Sophia asleep and seems very satisfied after feeding at this time.   Maternal Data    Feeding Feeding Type: Breast Fed Nipple Type: Slow - flow Length of feed: 10 min  LATCH Score/Interventions Latch: Grasps breast easily, tongue down, lips flanged, rhythmical sucking. Intervention(s): Assist with latch  Audible Swallowing: Spontaneous and intermittent Intervention(s): Skin to skin;Hand expression;Alternate breast massage  Type of Nipple: Everted at rest and after stimulation  Comfort (Breast/Nipple): Filling, red/small blisters or bruises, mild/mod discomfort Problem noted: Cracked, bleeding, blisters, bruises Intervention(s): Ice;Hand expression;Reverse pressure Intervention(s):  (coconut oil)  Problem noted: Mild/Moderate discomfort Interventions  (Cracked/bleeding/bruising/blister): Double electric pump Interventions (Mild/moderate discomfort): Hand massage;Hand expression;Post-pump;Comfort gels  Hold (Positioning): Assistance needed to correctly position infant at breast and maintain  latch. Intervention(s): Breastfeeding basics reviewed;Support Pillows;Position options;Skin to skin  LATCH Score: 8  Lactation Tools Discussed/Used Tools: Nipple Dorris CarnesShields;Flanges (decreased to 21  flanges) Nipple shield size: 24 Shell Type: Sore Breast pump type: Double-Electric Breast Pump   Consult Status Consult Status: Complete Date: 03/13/16 Follow-up type: Out-patient    Alfred LevinsLee, Denine Brotz Anne 03/02/2016, 1:06 PM

## 2016-03-02 NOTE — Lactation Note (Signed)
This note was copied from a baby's chart. Lactation Consultation Note  Patient Name: Martha Gutierrez ZOXWR'UToday's Date: 03/02/2016 Reason for consult: Follow-up assessment  Mom just finished pumping when LC entered room.  LC assessed right nipple with shield and changed from 20 to 24 because mom expressed having pain with nipple shield.  Mom's nipples are bruised and tender.  Breasts very full.  Mom has ice to right side.  Axillary milk duct noted under left arm.  Mom welcomed the suggestion to putting the baby to the breast in order for LC to observe feed.  Baby latched with lips flanged; good rhythmic jaw movement noted.  Mom fed for 20 minutes then LC suggested taking baby off momentarily to look and see if any colostrum was in the NS.  None noted; baby's mouth moist.  Questionable transfer of milk.  NP gave a DC order but to DC only after baby voided.  No void charted since 11/15.  Parents were unaware to check blue line on diaper.  When suck assessed, uncoordinated suck observed.  NP called for update and LC suggested that another feed be observed with pre and post weight and discussed possibility of making baby a baby patient.  LC to call NP back after wt. Assessment.  Mom given comfort gels and coconut oil to aid with friction when pumping.  LC to follow up closely.     Maternal Data    Feeding Feeding Type: Breast Fed Length of feed: 25 min  LATCH Score/Interventions Latch: Grasps breast easily, tongue down, lips flanged, rhythmical sucking. Intervention(s): Breast compression;Assist with latch;Breast massage  Audible Swallowing: A few with stimulation Intervention(s): Skin to skin;Hand expression  Type of Nipple: Everted at rest and after stimulation  Comfort (Breast/Nipple): Filling, red/small blisters or bruises, mild/mod discomfort Problem noted: Engorgment;Cracked, bleeding, blisters, bruises Intervention(s): Ice;Hand expression Intervention(s): Expressed breast milk to  nipple;Double electric pump  Interventions  (Cracked/bleeding/bruising/blister): Expressed breast milk to nipple Interventions (Mild/moderate discomfort): Hand massage;Hand expression;Post-pump;Comfort gels  Hold (Positioning): Assistance needed to correctly position infant at breast and maintain latch. Intervention(s): Breastfeeding basics reviewed;Support Pillows;Position options;Skin to skin  LATCH Score: 7  Lactation Tools Discussed/Used Tools: Nipple Shields;Pump;Comfort gels;Other (comment) (coconut oil/instructed not to use at the same time as gels) Nipple shield size: 24 (changed NS from 20 to 24) Shell Type: Sore Breast pump type: Double-Electric Breast Pump   Consult Status Consult Status: Follow-up Date: 03/02/16 Follow-up type: In-patient    Maryruth HancockKelly Suzanne [facility] Center[facility] 03/02/2016, 11:37 AM

## 2016-03-02 NOTE — Discharge Instructions (Signed)
Postpartum Depression and Baby Blues °The postpartum period begins right after the birth of a baby. During this time, there is often a great amount of joy and excitement. It is also a time of many changes in the life of the parents. Regardless of how many times a mother gives birth, each child brings new challenges and dynamics to the family. It is not unusual to have feelings of excitement along with confusing shifts in moods, emotions, and thoughts. All mothers are at risk of developing postpartum depression or the "baby blues." These mood changes can occur right after giving birth, or they may occur many months after giving birth. The baby blues or postpartum depression can be mild or severe. Additionally, postpartum depression can go away rather quickly, or it can be a long-term condition. °What are the causes? °Raised hormone levels and the rapid drop in those levels are thought to be a main cause of postpartum depression and the baby blues. A number of hormones change during and after pregnancy. Estrogen and progesterone usually decrease right after the delivery of your baby. The levels of thyroid hormone and various cortisol steroids also rapidly drop. Other factors that play a role in these mood changes include major life events and genetics. °What increases the risk? °If you have any of the following risks for the baby blues or postpartum depression, know what symptoms to watch out for during the postpartum period. Risk factors that may increase the likelihood of getting the baby blues or postpartum depression include: °· Having a personal or family history of depression. °· Having depression while being pregnant. °· Having premenstrual mood issues or mood issues related to oral contraceptives. °· Having a lot of life stress. °· Having marital conflict. °· Lacking a social support network. °· Having a baby with special needs. °· Having health problems, such as diabetes. °What are the signs or  symptoms? °Symptoms of baby blues include: °· Brief changes in mood, such as going from extreme happiness to sadness. °· Decreased concentration. °· Difficulty sleeping. °· Crying spells, tearfulness. °· Irritability. °· Anxiety. °Symptoms of postpartum depression typically begin within the first month after giving birth. These symptoms include: °· Difficulty sleeping or excessive sleepiness. °· Marked weight loss. °· Agitation. °· Feelings of worthlessness. °· Lack of interest in activity or food. °Postpartum psychosis is a very serious condition and can be dangerous. Fortunately, it is rare. Displaying any of the following symptoms is cause for immediate medical attention. Symptoms of postpartum psychosis include: °· Hallucinations and delusions. °· Bizarre or disorganized behavior. °· Confusion or disorientation. °How is this diagnosed? °A diagnosis is made by an evaluation of your symptoms. There are no medical or lab tests that lead to a diagnosis, but there are various questionnaires that a health care provider may use to identify those with the baby blues, postpartum depression, or psychosis. Often, a screening tool called the Edinburgh Postnatal Depression Scale is used to diagnose depression in the postpartum period. °How is this treated? °The baby blues usually goes away on its own in 1-2 weeks. Social support is often all that is needed. You will be encouraged to get adequate sleep and rest. Occasionally, you may be given medicines to help you sleep. °Postpartum depression requires treatment because it can last several months or longer if it is not treated. Treatment may include individual or group therapy, medicine, or both to address any social, physiological, and psychological factors that may play a role in the depression. Regular exercise, a   healthy diet, rest, and social support may also be strongly recommended. °Postpartum psychosis is more serious and needs treatment right away. Hospitalization is  often needed. °Follow these instructions at home: °· Get as much rest as you can. Nap when the baby sleeps. °· Exercise regularly. Some women find yoga and walking to be beneficial. °· Eat a balanced and nourishing diet. °· Do little things that you enjoy. Have a cup of tea, take a bubble bath, read your favorite magazine, or listen to your favorite music. °· Avoid alcohol. °· Ask for help with household chores, cooking, grocery shopping, or running errands as needed. Do not try to do everything. °· Talk to people close to you about how you are feeling. Get support from your partner, family members, friends, or other new moms. °· Try to stay positive in how you think. Think about the things you are grateful for. °· Do not spend a lot of time alone. °· Only take over-the-counter or prescription medicine as directed by your health care provider. °· Keep all your postpartum appointments. °· Let your health care provider know if you have any concerns. °Contact a health care provider if: °You are having a reaction to or problems with your medicine. °Get help right away if: °· You have suicidal feelings. °· You think you may harm the baby or someone else. °This information is not intended to replace advice given to you by your health care provider. Make sure you discuss any questions you have with your health care provider. °Document Released: 01/05/2004 Document Revised: 09/08/2015 Document Reviewed: 01/12/2013 °Elsevier Interactive Patient Education © 2017 [employer] °Postpartum Care After Vaginal Delivery °The period of time right after you deliver your newborn is called the postpartum period. °What kind of medical care will I receive? °· You may continue to receive fluids and medicines through an IV tube inserted into one of your veins. °· If an incision was made near your vagina (episiotomy) or if you had some vaginal tearing during delivery, cold compresses may be placed on your episiotomy or your tear. This  helps to reduce pain and swelling. °· You may be given a squirt bottle to use when you go to the bathroom. You may use this until you are comfortable wiping as usual. To use the squirt bottle, follow these steps: °¨ Before you urinate, fill the squirt bottle with warm water. Do not use hot water. °¨ After you urinate, while you are sitting on the toilet, use the squirt bottle to rinse the area around your urethra and vaginal opening. This rinses away any urine and blood. °¨ You may do this instead of wiping. As you start healing, you may use the squirt bottle before wiping yourself. Make sure to wipe gently. °¨ Fill the squirt bottle with clean water every time you use the bathroom. °· You will be given sanitary pads to wear. °How can I expect to feel? °· You may not feel the need to urinate for several hours after delivery. °· You will have some soreness and pain in your abdomen and vagina. °· If you are breastfeeding, you may have uterine contractions every time you breastfeed for up to several weeks postpartum. Uterine contractions help your uterus return to its normal size. °· It is normal to have vaginal bleeding (lochia) after delivery. The amount and appearance of lochia is often similar to a menstrual period in the first week after delivery. It will gradually decrease over the next few weeks to a   dry, yellow-brown discharge. For most women, lochia stops completely by 6-8 weeks after delivery. Vaginal bleeding can vary from woman to woman. °· Within the first few days after delivery, you may have breast engorgement. This is when your breasts feel heavy, full, and uncomfortable. Your breasts may also throb and feel hard, tightly stretched, warm, and tender. After this occurs, you may have milk leaking from your breasts. Your health care provider can help you relieve discomfort due to breast engorgement. Breast engorgement should go away within a few days. °· You may feel more sad or worried than normal due to  hormonal changes after delivery. These feelings should not last more than a few days. If these feelings do not go away after several days, speak with your health care provider. °How should I care for myself? °· Tell your health care provider if you have pain or discomfort. °· Drink enough water to keep your urine clear or pale yellow. °· Wash your hands thoroughly with soap and water for at least 20 seconds after changing your sanitary pads, after using the toilet, and before holding or feeding your baby. °· If you are not breastfeeding, avoid touching your breasts a lot. Doing this can make your breasts produce more milk. °· If you become weak or lightheaded, or you feel like you might faint, ask for help before: °¨ Getting out of bed. °¨ Showering. °· Change your sanitary pads frequently. Watch for any changes in your flow, such as a sudden increase in volume, a change in color, the passing of large blood clots. If you pass a blood clot from your vagina, save it to show to your health care provider. Do not flush blood clots down the toilet without having your health care provider look at them. °· Make sure that all your vaccinations are up to date. This can help protect you and your baby from getting certain diseases. You may need to have immunizations done before you leave the hospital. °· If desired, talk with your health care provider about methods of family planning or birth control (contraception). °How can I start bonding with my baby? °Spending as much time as possible with your baby is very important. During this time, you and your baby can get to know each other and develop a bond. Having your baby stay with you in your room (rooming in) can give you time to get to know your baby. Rooming in can also help you become comfortable caring for your baby. Breastfeeding can also help you bond with your baby. °How can I plan for returning home with my baby? °· Make sure that you have a car seat installed in your  vehicle. °¨ Your car seat should be checked by a certified car seat installer to make sure that it is installed safely. °¨ Make sure that your baby fits into the car seat safely. °· Ask your health care provider any questions you have about caring for yourself or your baby. Make sure that you are able to contact your health care provider with any questions after leaving the hospital. °This information is not intended to replace advice given to you by your health care provider. Make sure you discuss any questions you have with your health care provider. °Document Released: 01/28/2007 Document Revised: 09/05/2015 Document Reviewed: 03/07/2015 °Elsevier Interactive Patient Education © 2017 [employer] °Breastfeeding and Mastitis °Mastitis is inflammation of the breast tissue. It can occur in women who are breastfeeding. This can make breastfeeding painful. Mastitis will   sometimes go away on its own. Your health care provider will help determine if treatment is needed. °CAUSES °Mastitis is often associated with a blocked milk (lactiferous) duct. This can happen when too much milk builds up in the breast. Causes of excess milk in the breast can include: °· Poor latch-on. If your baby is not latched onto the breast properly, she or he may not empty your breast completely while breastfeeding. °· Allowing too much time to pass between feedings. °· Wearing a bra or other clothing that is too tight. This puts extra pressure on the lactiferous ducts so milk does not flow through them as it should. °Mastitis can also be caused by a bacterial infection. Bacteria may enter the breast tissue through cuts or openings in the skin. In women who are breastfeeding, this may occur because of cracked or irritated skin. Cracks in the skin are often caused when your baby does not latch on properly to the breast. °SIGNS AND SYMPTOMS °· Swelling, redness, tenderness, and pain in an area of the breast. °· Swelling of the glands under the  arm on the same side. °· Fever may or may not accompany mastitis. °If an infection is allowed to progress, a collection of pus (abscess) may develop. °DIAGNOSIS  °Your health care provider can usually diagnose mastitis based on your symptoms and a physical exam. Tests may be done to help confirm the diagnosis. These may include: °· Removal of pus from the breast by applying pressure to the area. This pus can be examined in the lab to determine which bacteria are present. If an abscess has developed, the fluid in the abscess can be removed with a needle. This can also be used to confirm the diagnosis and determine the bacteria present. In most cases, pus will not be present. °· Blood tests to determine if your body is fighting a bacterial infection. °· Mammogram or ultrasound tests to rule out other problems or diseases. °TREATMENT  °Mastitis that occurs with breastfeeding will sometimes go away on its own. Your health care provider may choose to wait 24 hours after first seeing you to decide whether a prescription medicine is needed. If your symptoms are worse after 24 hours, your health care provider will likely prescribe an antibiotic medicine to treat the mastitis. He or she will determine which bacteria are most likely causing the infection and will then select an appropriate antibiotic medicine. This is sometimes changed based on the results of tests performed to identify the bacteria, or if there is no response to the antibiotic medicine selected. Antibiotic medicines are usually given by mouth. You may also be given medicine for pain. °HOME CARE INSTRUCTIONS °· Only take over-the-counter or prescription medicines for pain, fever, or discomfort as directed by your health care provider. °· If your health care provider prescribed an antibiotic medicine, take the medicine as directed. Make sure you finish it even if you start to feel better. °· Do not wear a tight or underwire bra. Wear a soft, supportive  bra. °· Increase your fluid intake, especially if you have a fever. °· Continue to empty the breast. Your health care provider can tell you whether this milk is safe for your infant or needs to be thrown out. You may be told to stop nursing until your health care provider thinks it is safe for your baby. Use a breast pump if you are advised to stop nursing. °· Keep your nipples clean and dry. °· Empty the first breast completely   before going to the other breast. If your baby is not emptying your breasts completely for some reason, use a breast pump to empty your breasts.  If you go back to work, pump your breasts while at work to stay in time with your nursing schedule.  Avoid allowing your breasts to become overly filled with milk (engorged). SEEK MEDICAL CARE IF:  You have pus-like discharge from the breast.  Your symptoms do not improve with the treatment prescribed by your health care provider within 2 days. SEEK IMMEDIATE MEDICAL CARE IF:  Your pain and swelling are getting worse.  You have pain that is not controlled with medicine.  You have a red line extending from the breast toward your armpit.  You have a fever or persistent symptoms for more than 2-3 days.  You have a fever and your symptoms suddenly get worse. MAKE SURE YOU:   Understand these instructions.  Will watch your condition.  Will get help right away if you are not doing well or get worse. This information is not intended to replace advice given to you by your health care provider. Make sure you discuss any questions you have with your health care provider. Document Released: 07/28/2004 Document Revised: 04/07/2013 Document Reviewed: 11/06/2012 Elsevier Interactive Patient Education  2017 [employer] Breast Pumping Tips If you are breastfeeding, there may be times when you cannot feed your baby directly. Returning to work or going on a trip are common examples. Pumping allows you to store breast milk and feed  it to your baby later. You may not get much milk when you first start to pump. Your breasts should start to make more after a few days. If you pump at the times you usually feed your baby, you may be able to keep making enough milk to feed your baby without also using formula. The more often you pump, the more milk you will produce. When should I pump?  You can begin to pump soon after delivery. However, some experts recommend waiting about 4 weeks before giving your infant a bottle to make sure breastfeeding is going well.  If you plan to return to work, begin pumping a few weeks before. This will help you develop techniques that work best for you. It also lets you build up a supply of breast milk.  When you are with your infant, feed on demand and pump after each feeding.  When you are away from your infant for several hours, pump for about 15 minutes every 2-3 hours. Pump both breasts at the same time if you can.  If your infant has a formula feeding, make sure to pump around the same time.  If you drink any alcohol, wait 2 hours before pumping. How do I prepare to pump? Your let-down reflexis the natural reaction to stimulation that makes your breast milk flow. It is easier to stimulate this reflex when you are relaxed. Find relaxation techniques that work for you. If you have difficulty with your let-down reflex, try these methods:  Smell one of your infant's blankets or an item of clothing.  Look at a picture or video of your infant.  Sit in a quiet, private space.  Massage the breast you plan to pump.  Place soothing warmth on the breast.  Play relaxing music. What are some general breast pumping tips?  Wash your hands before you pump. You do not need to wash your nipples or breasts.  There are three ways to pump.  You  can use your hand to massage and compress your breast.  You can use a handheld manual pump.  You can use an electric pump.  Make sure the suction cup  (flange) on the breast pump is the right size. Place the flange directly over the nipple. If it is the wrong size or placed the wrong way, it may be painful and cause nipple damage.  If pumping is uncomfortable, apply a small amount of purified or modified lanolin to your nipple and areola.  If you are using an electric pump, adjust the speed and suction power to be more comfortable.  If pumping is painful or if you are not getting very much milk, you may need a different type of pump. A lactation consultant can help you determine what type of pump to use.  Keep a full water bottle near you at all times. Drinking lots of fluid helps you make more milk.  You can store your milk to use later. Pumped breast milk can be stored in a sealable, sterile container or plastic bag. Label all stored breast milk with the date you pumped it.  Milk can stay out at room temperature for up to 8 hours.  You can store your milk in the refrigerator for up to 8 days.  You can store your milk in the freezer for 3 months. Thaw frozen milk using warm water. Do not put it in the microwave.  Do not smoke. Smoking can lower your milk supply and harm your infant. If you need help quitting, ask your health care provider to recommend a program. When should I call my health care provider or a lactation consultant?  You are having trouble pumping.  You are concerned that you are not making enough milk.  You have nipple pain, soreness, or redness.  You want to use birth control. Birth control pills may lower your milk supply. Talk to your health care provider about your options. This information is not intended to replace advice given to you by your health care provider. Make sure you discuss any questions you have with your health care provider. Document Released: 09/20/2009 Document Revised: 09/14/2015 Document Reviewed: 01/23/2013 Elsevier Interactive Patient Education  2017 [employer]. Breastfeeding Deciding  to breastfeed is one of the best choices you can make for you and your baby. A change in hormones during pregnancy causes your breast tissue to grow and increases the number and size of your milk ducts. These hormones also allow proteins, sugars, and fats from your blood supply to make breast milk in your milk-producing glands. Hormones prevent breast milk from being released before your baby is born as well as prompt milk flow after birth. Once breastfeeding has begun, thoughts of your baby, as well as his or her sucking or crying, can stimulate the release of milk from your milk-producing glands. Benefits of breastfeeding For Your Baby  Your first milk (colostrum) helps your baby's digestive system function better.  There are antibodies in your milk that help your baby fight off infections.  Your baby has a lower incidence of asthma, allergies, and sudden infant death syndrome.  The nutrients in breast milk are better for your baby than infant formulas and are designed uniquely for your babys needs.  Breast milk improves your baby's brain development.  Your baby is less likely to develop other conditions, such as childhood obesity, asthma, or type 2 diabetes mellitus. For You  Breastfeeding helps to create a very special bond between you and your  baby.  Breastfeeding is convenient. Breast milk is always available at the correct temperature and costs nothing.  Breastfeeding helps to burn calories and helps you lose the weight gained during pregnancy.  Breastfeeding makes your uterus contract to its prepregnancy size faster and slows bleeding (lochia) after you give birth.  Breastfeeding helps to lower your risk of developing type 2 diabetes mellitus, osteoporosis, and breast or ovarian cancer later in life. Signs that your baby is hungry Early Signs of Hunger  Increased alertness or activity.  Stretching.  Movement of the head from side to side.  Movement of the head and opening  of the mouth when the corner of the mouth or cheek is stroked (rooting).  Increased sucking sounds, smacking lips, cooing, sighing, or squeaking.  Hand-to-mouth movements.  Increased sucking of fingers or hands. Late Signs of Hunger  Fussing.  Intermittent crying. Extreme Signs of Hunger  Signs of extreme hunger will require calming and consoling before your baby will be able to breastfeed successfully. Do not wait for the following signs of extreme hunger to occur before you initiate breastfeeding:  Restlessness.  A loud, strong cry.  Screaming. Breastfeeding basics  Breastfeeding Initiation  Find a comfortable place to sit or lie down, with your neck and back well supported.  Place a pillow or rolled up blanket under your baby to bring him or her to the level of your breast (if you are seated). Nursing pillows are specially designed to help support your arms and your baby while you breastfeed.  Make sure that your baby's abdomen is facing your abdomen.  Gently massage your breast. With your fingertips, massage from your chest wall toward your nipple in a circular motion. This encourages milk flow. You may need to continue this action during the feeding if your milk flows slowly.  Support your breast with 4 fingers underneath and your thumb above your nipple. Make sure your fingers are well away from your nipple and your babys mouth.  Stroke your baby's lips gently with your finger or nipple.  When your baby's mouth is open wide enough, quickly bring your baby to your breast, placing your entire nipple and as much of the colored area around your nipple (areola) as possible into your baby's mouth.  More areola should be visible above your baby's upper lip than below the lower lip.  Your baby's tongue should be between his or her lower gum and your breast.  Ensure that your baby's mouth is correctly positioned around your nipple (latched). Your baby's lips should create a  seal on your breast and be turned out (everted).  It is common for your baby to suck about 2-3 minutes in order to start the flow of breast milk. Latching  Teaching your baby how to latch on to your breast properly is very important. An improper latch can cause nipple pain and decreased milk supply for you and poor weight gain in your baby. Also, if your baby is not latched onto your nipple properly, he or she may swallow some air during feeding. This can make your baby fussy. Burping your baby when you switch breasts during the feeding can help to get rid of the air. However, teaching your baby to latch on properly is still the best way to prevent fussiness from swallowing air while breastfeeding. Signs that your baby has successfully latched on to your nipple:  Silent tugging or silent sucking, without causing you pain.  Swallowing heard between every 3-4 sucks.  Muscle  movement above and in front of his or her ears while sucking. Signs that your baby has not successfully latched on to nipple:  Sucking sounds or smacking sounds from your baby while breastfeeding.  Nipple pain. If you think your baby has not latched on correctly, slip your finger into the corner of your babys mouth to break the suction and place it between your baby's gums. Attempt breastfeeding initiation again. Signs of Successful Breastfeeding  Signs from your baby:  A gradual decrease in the number of sucks or complete cessation of sucking.  Falling asleep.  Relaxation of his or her body.  Retention of a small amount of milk in his or her mouth.  Letting go of your breast by himself or herself. Signs from you:  Breasts that have increased in firmness, weight, and size 1-3 hours after feeding.  Breasts that are softer immediately after breastfeeding.  Increased milk volume, as well as a change in milk consistency and color by the fifth day of breastfeeding.  Nipples that are not sore, cracked, or  bleeding. Signs That Your Pecola Leisure is Getting Enough Milk  Wetting at least 1-2 diapers during the first 24 hours after birth.  Wetting at least 5-6 diapers every 24 hours for the first week after birth. The urine should be clear or pale yellow by 5 days after birth.  Wetting 6-8 diapers every 24 hours as your baby continues to grow and develop.  At least 3 stools in a 24-hour period by age 89 days. The stool should be soft and yellow.  At least 3 stools in a 24-hour period by age 658 days. The stool should be seedy and yellow.  No loss of weight greater than 10% of birth weight during the first 1 days of age.  Average weight gain of 4-7 ounces (113-198 g) per week after age 65 days.  Consistent daily weight gain by age 89 days, without weight loss after the age of 2 weeks. After a feeding, your baby may spit up a small amount. This is common. Breastfeeding frequency and duration Frequent feeding will help you make more milk and can prevent sore nipples and breast engorgement. Breastfeed when you feel the need to reduce the fullness of your breasts or when your baby shows signs of hunger. This is called "breastfeeding on demand." Avoid introducing a pacifier to your baby while you are working to establish breastfeeding (the first 4-6 weeks after your baby is born). After this time you may choose to use a pacifier. Research has shown that pacifier use during the first year of a baby's life decreases the risk of sudden infant death syndrome (SIDS). Allow your baby to feed on each breast as long as he or she wants. Breastfeed until your baby is finished feeding. When your baby unlatches or falls asleep while feeding from the first breast, offer the second breast. Because newborns are often sleepy in the first few weeks of life, you may need to awaken your baby to get him or her to feed. Breastfeeding times will vary from baby to baby. However, the following rules can serve as a guide to help you ensure  that your baby is properly fed:  Newborns (babies 94 weeks of age or younger) may breastfeed every 1-3 hours.  Newborns should not go longer than 3 hours during the day or 5 hours during the night without breastfeeding.  You should breastfeed your baby a minimum of 8 times in a 24-hour period until you begin  to introduce solid foods to your baby at around 146 months of age. Breast milk pumping Pumping and storing breast milk allows you to ensure that your baby is exclusively fed your breast milk, even at times when you are unable to breastfeed. This is especially important if you are going back to work while you are still breastfeeding or when you are not able to be present during feedings. Your lactation consultant can give you guidelines on how long it is safe to store breast milk. A breast pump is a machine that allows you to pump milk from your breast into a sterile bottle. The pumped breast milk can then be stored in a refrigerator or freezer. Some breast pumps are operated by hand, while others use electricity. Ask your lactation consultant which type will work best for you. Breast pumps can be purchased, but some hospitals and breastfeeding support groups lease breast pumps on a monthly basis. A lactation consultant can teach you how to hand express breast milk, if you prefer not to use a pump. Caring for your breasts while you breastfeed Nipples can become dry, cracked, and sore while breastfeeding. The following recommendations can help keep your breasts moisturized and healthy:  Avoid using soap on your nipples.  Wear a supportive bra. Although not required, special nursing bras and tank tops are designed to allow access to your breasts for breastfeeding without taking off your entire bra or top. Avoid wearing underwire-style bras or extremely tight bras.  Air dry your nipples for 3-714minutes after each feeding.  Use only cotton bra pads to absorb leaked breast milk. Leaking of breast milk  between feedings is normal.  Use lanolin on your nipples after breastfeeding. Lanolin helps to maintain your skin's normal moisture barrier. If you use pure lanolin, you do not need to wash it off before feeding your baby again. Pure lanolin is not toxic to your baby. You may also hand express a few drops of breast milk and gently massage that milk into your nipples and allow the milk to air dry. In the first few weeks after giving birth, some women experience extremely full breasts (engorgement). Engorgement can make your breasts feel heavy, warm, and tender to the touch. Engorgement peaks within 3-5 days after you give birth. The following recommendations can help ease engorgement:  Completely empty your breasts while breastfeeding or pumping. You may want to start by applying warm, moist heat (in the shower or with warm water-soaked hand towels) just before feeding or pumping. This increases circulation and helps the milk flow. If your baby does not completely empty your breasts while breastfeeding, pump any extra milk after he or she is finished.  Wear a snug bra (nursing or regular) or tank top for 1-2 days to signal your body to slightly decrease milk production.  Apply ice packs to your breasts, unless this is too uncomfortable for you.  Make sure that your baby is latched on and positioned properly while breastfeeding. If engorgement persists after 48 hours of following these recommendations, contact your health care provider or a Advertising copywriterlactation consultant. Overall health care recommendations while breastfeeding  Eat healthy foods. Alternate between meals and snacks, eating 3 of each per day. Because what you eat affects your breast milk, some of the foods may make your baby more irritable than usual. Avoid eating these foods if you are sure that they are negatively affecting your baby.  Drink milk, fruit juice, and water to satisfy your thirst (about 10 glasses a  day).  Rest often, relax, and  continue to take your prenatal vitamins to prevent fatigue, stress, and anemia.  Continue breast self-awareness checks.  Avoid chewing and smoking tobacco. Chemicals from cigarettes that pass into breast milk and exposure to secondhand smoke may harm your baby.  Avoid alcohol and drug use, including marijuana. Some medicines that may be harmful to your baby can pass through breast milk. It is important to ask your health care provider before taking any medicine, including all over-the-counter and prescription medicine as well as vitamin and herbal supplements. It is possible to become pregnant while breastfeeding. If birth control is desired, ask your health care provider about options that will be safe for your baby. Contact a health care provider if:  You feel like you want to stop breastfeeding or have become frustrated with breastfeeding.  You have painful breasts or nipples.  Your nipples are cracked or bleeding.  Your breasts are red, tender, or warm.  You have a swollen area on either breast.  You have a fever or chills.  You have nausea or vomiting.  You have drainage other than breast milk from your nipples.  Your breasts do not become full before feedings by the fifth day after you give birth.  You feel sad and depressed.  Your baby is too sleepy to eat well.  Your baby is having trouble sleeping.  Your baby is wetting less than 3 diapers in a 24-hour period.  Your baby has less than 3 stools in a 24-hour period.  Your baby's skin or the white part of his or her eyes becomes yellow.  Your baby is not gaining weight by 615 days of age. Get help right away if:  Your baby is overly tired (lethargic) and does not want to wake up and feed.  Your baby develops an unexplained fever. This information is not intended to replace advice given to you by your health care provider. Make sure you discuss any questions you have with your health care provider. Document  Released: 04/02/2005 Document Revised: 09/14/2015 Document Reviewed: 09/24/2012 Elsevier Interactive Patient Education  2017 [employer]

## 2016-03-05 ENCOUNTER — Encounter (HOSPITAL_COMMUNITY)
Admission: RE | Admit: 2016-03-05 | Discharge: 2016-03-05 | Disposition: A | Discharge status: Home / Self Care | Source: Ambulatory Visit | Attending: Obstetrics & Gynecology | Admitting: Obstetrics & Gynecology

## 2016-03-05 DIAGNOSIS — O9213 Cracked nipple associated with lactation: Secondary | ICD-10-CM | POA: Insufficient documentation

## 2016-03-05 NOTE — Lactation Note (Signed)
Lactation Consult  Mother's reason for visit:  Cracked nipples Visit Type:  Outpatient Appointment Notes:  Baby Sophia now 65 days old. Mom has cracked nipples bilateral, no bleeding reported with feedings. Mom used nipple shield in hospital but stopped after getting home reporting she did not want baby to get used to nipple shield and told was for short term use.  Consult:  Initial Lactation Consultant:  Alfred LevinsGranger, Worthington Cruzan Ann  ________________________________________________________________________   Baby's Name:  Robby SermonSophia Afton Schoenfelder Date of Birth:  02/29/2018 Pediatrician:  Dr. Eddie Candleummings Gender:  female Gestational Age: 2021w3d (At Birth) Birth Weight:  8 lb 2.7 oz (3705 g) Weight at Discharge:  Weight: 7 lb 13.6 oz (3560 g)             Date of Discharge:  03/02/2016      [facility]   [facility] 2135 03/01/16 2346 03/02/16 1215  Weight: 8 lb 2.7 oz (3705 g) 7 lb 14.8 oz (3595 g) 7 lb 13.6 oz (3560 g)  [facility]  8 lb. 0 oz/  03/04/16     Location:Pediatrician's office Weight today:  8 lb. 0.3 oz/3636 gm  With clean diaper  ________________________________________________________________________  Mother's Name: Freddrick MarchAna Gabriela Upson Type of delivery:  SVB Breastfeeding Experience:  P1 Maternal Medical Conditions:  None reported Maternal Medications:  Motrin prn, PNV  ________________________________________________________________________  Breastfeeding History (Post Discharge)  Frequency of breastfeeding:  Every 2 hours Duration of feeding:  20 minutes  Mom is pumping 1 time/day using Spectra DEBP receiving 30 ml total. Not supplementing baby at this time.  Infant Intake and Output Assessment  Voids:  6 in 24 hrs.  Color:  Clear yellow Stools:  2-3 in 24 hrs.  Color:  Trudee GripGreen and Brown  ________________________________________________________________________  Maternal Breast Assessment  Breast:  Full, almost engorged, some  nodules palpable both breasts. Soft nodules noted in axilla bilateral.  Nipple:  Cracked bilateral. Short nipple shafts bilateral, aerola edema bilateral. Pain level:  5 Pain interventions:  EBM/coconut oil  _______________________________________________________________________ Feeding Assessment/Evaluation  Initial feeding assessment:  Infant's oral assessment:  Variance. LC notes baby to have short lingual frenulum down to gum line. Short posterior lingual frenulum. LC notes rubbing of lower gum line on finger with suck exam.   Positioning:  Cross cradle. Had Mom pre-pump prior to latch to remove some EBM and soften nipple/aerola so baby could latch.  Right breast  After Mom pre-pumped assisted Mom with positioning and supporting breast to latch. Assisted with un-tucking lower lip. Baby did appear to have wide gape at breast and was able to keep lips well flanged. Appeared to have good suckling bursts with BF for 20 minutes. Mom's PS was 5 with initial latch but improved to 2-3. Crease present when baby came off breast in spite of what appeared to be good latch. No bleeding from nipple. Baby only transferred 8 ml in 20 minutes. Mom right breast start to soften but firmness still present.   On left breast, after Mom had pre-pumped, applied #20 nipple shield and assisted Mom to latch. Baby again had wide gape with well flanged lips. Baby nursed for 20 minutes and transferred 30 ml when using nipple shield. Mom's PS with nursing was 3 with initial latch resolving to 1-2. Nipple was round when baby came off the breast. Lots of breast milk in nipple shield at the end of the feeding. Mom's left breast softened well after the feeding, no bleeding.  Tools:  Nipple shield 20 mm. Reviewed cleaning/sanitizing. Hand out given. Mom able to demonstrate how to apply nipple shield correctly.   Pre-feed weight:  3636 g  (8 lb. 0.3 oz.) Post-feed weight:  3674 g (8 lb. 1.6 oz.) Amount transferred:  38  Ml   With nursing from both breasts as described above  Total amount pumped post feed:  R 25 ml    L 20 ml. Breast softened well with post pumping.   Total amount transferred:  38 ml Total supplement given:  12 ml  EBM via bottle  [facility] BF each feeding - at least 8-12 times in 24 hours. Try to keep baby nursing for 20 minutes both breasts most feedings. Breast should soften with feedings.  Pre-pump for 3-5 minutes thru 1st milk ejection to soften nipple/aerola. Use #20 nipple shield to latch baby, look for breast milk in nipple shield with feedings. Baby transferred milk much better using nipple shield and Mom had less discomfort.  Post pump as needed to soften breasts and resolve nodules. Use ice packs after feedings for the next 24 hours. Can apply warm compresses prior to BF if needed to get milk moving but be sure to use ice after the feeding. OP f/u with lactation for Tuesday, 03/13/16 at 2:30. Call OB for S/S of mastitis if develop.  Call and schedule weight check with Peds for later this week as directed by Peds yesterday.

## 2016-03-13 ENCOUNTER — Ambulatory Visit (HOSPITAL_COMMUNITY): Admit: 2016-03-13

## 2016-03-13 ENCOUNTER — Ambulatory Visit (HOSPITAL_COMMUNITY)
Admission: RE | Admit: 2016-03-13 | Discharge: 2016-03-13 | Disposition: A | Discharge status: Home / Self Care | Source: Ambulatory Visit | Attending: Obstetrics & Gynecology | Admitting: Obstetrics & Gynecology

## 2016-03-13 NOTE — Lactation Note (Signed)
Lactation Consult  Mother's reason for visit:  F/U from OP visit 03/05/16, Mom had cracked nipples Visit Type:  Outpatient Appointment Notes:  Mom had cracked nipples at last visit. [facility] and used nipple shield for few days till nipples healed. Now reports baby latching well without the nipple shield. Breasts now emptying with nursing. Pain resolved with nursing.  Consult:  Follow-Up Lactation Consultant:  Alfred LevinsGranger, Taneah Masri Ann  ________________________________________________________________________ Initial weight previous visit 03/05/16 8 lb. 0.3 oz  At 95 days of age Today - initial weight 03/13/16  8 lb. 6.3 oz at 4813 days of age Weight gain of: 6.0 oz in 8 days.   ________________________________________________________________________  Mother's Name: Martha MarchAna Gabriela Gutierrez Type of delivery:  SVB Breastfeeding Experience:  P1 Maternal Medical Conditions:  None reported.  Maternal Medications:  Motrin prn, PNV  ________________________________________________________________________  Breastfeeding History (Post Discharge)  Frequency of breastfeeding:  Every 2-3 hours Duration of feeding:  30-40 minutes  Mom has Spectra DEBP - she is pumping only as needed if breast feel full. Mom reports receiving 60 ml in 5 minutes with pumping. Mom is supplementing 1-2 times per day, mostly at night, 30 ml of EBM using Dr. Manson PasseyBrown bottle #1.  Infant Intake and Output Assessment  Voids:  6 in 24 hrs.  Color:  Clear yellow Stools:  4-5 in 24 hrs.  Color:  Yellow/seedy  ________________________________________________________________________  Maternal Breast Assessment  Breast:  Soft. Mom has small nodule under right axilla, but it is soft and smaller than last week. Mom reports improving. Advised warm compress prior to nursing with massage while nursing.  Nipple:  Erect, no redness or trauma Pain level:   0 _______________________________________________________________________ Feeding Assessment/Evaluation  Initial feeding assessment:  Infant's oral assessment:  Variance. Baby does have short labial frenulum along with short posterior lingual frenulum. However, more mobility noted with suck exam today.   Positioning:  Cross cradle Left breast  LATCH documentation:  Latch:  2 = Grasps breast easily, tongue down, lips flanged, rhythmical sucking.  Audible swallowing:  2 = Spontaneous and intermittent  Type of nipple:  2 = Everted at rest and after stimulation  Comfort (Breast/Nipple):  2 = Soft / non-tender  Hold (Positioning):  1 = Assistance needed to correctly position infant at breast and maintain latch  LATCH score:  9  Attached assessment:  Deep  Lips flanged:  Yes.    Lips untucked:  Yes.    Suck assessment:  Nutritive  Pre-feed weight:  3806 g  (8 lb. 6.3 oz.) Post-feed weight:  3834 g (8 lb. 7.3 oz.) Amount transferred:  28 ml with nursing for 20 minutes   Additional Feeding Assessment -   Infant's oral assessment:  Variance  Positioning:  Cross cradle Right breast  LATCH documentation:  Latch:  1 = Repeated attempts needed to sustain latch, nipple held in mouth throughout feeding, stimulation needed to elicit sucking reflex.  Audible swallowing:  2 = Spontaneous and intermittent  Type of nipple:  2 = Everted at rest and after stimulation  Comfort (Breast/Nipple):  2 = Soft / non-tender  Hold (Positioning):  1 = Assistance needed to correctly position infant at breast and maintain latch  LATCH score:  8  Attached assessment:  Deep  Lips flanged:  Yes.    Lips untucked:  Yes.    Suck assessment:  Nutritive  Pre-feed weight:  3834 g  (8 lb. 7.3 oz.) Post-feed weight:  3852 g (8 lb. 7.9 oz.) Amount transferred:  18 ml with nursing for 23 minutes Amount supplemented:  19 ml of EBM via bottle  Total amount pumped post feed:  R 11 ml with 27 flange,   L 19 ml  with 24 flange  Total amount transferred:  46 ml Total supplement given:  19 ml  Mom's nipples have healed well. Baby transferred more milk today with BF without nipple shield. Baby has gained 6 oz in 8 days. All good news. Mom post pumped a total of 30 ml after the feeding, so Mom has good milk supply. Advised Mom to keep doing what she is doing since baby is gaining well, nursing improving. Did suggest post pumping 4 times/day and giving baby back EBM as supplement since baby transferred 46 ml today and would like to see baby getting at least 60 ml with feedings. Mom plans to continue to give supplement at night.  Mom not sleeping well at night, considering Melatonin - L3 per [place], reviewed with Mom. OP f/u scheduled for Tuesday, 03/20/16 1:00pm to be sure breastfeeding is continuing to improve and baby is continuing to transfer milk well.  Encouraged support group.

## 2016-03-20 ENCOUNTER — Ambulatory Visit (HOSPITAL_COMMUNITY)
Admission: RE | Admit: 2016-03-20 | Discharge: 2016-03-20 | Disposition: A | Discharge status: Home / Self Care | Source: Ambulatory Visit | Attending: Obstetrics & Gynecology | Admitting: Obstetrics & Gynecology

## 2016-03-20 NOTE — Lactation Note (Addendum)
LC visit for Martha CoronaAna G. Gutierrez (mother) and Martha SeatSophia Gutierrez (DOB: [birth year]) F/u LC visit from 03-13-16 BW: 8# 2.7oz Nadir (d/c weight): 7# 13.6oz Wt on 03-05-16: 8# 0.3oz Wt on 03-13-16 8# 6.3oz Infant's weight today: 8# 15.8oz (4078g) at almost 673 weeks of age  Mom pumps prn using a Spectra. Infant receives bottles of EBM after breastfeeding at night.  Mom nurses q2-3hrs & feedings last 30-40 minutes. Infant is voiding & stooling well.   Pre-feed weight: 4078g Post-feed weight: 4112g Amount transferred: 34ml R breast, cross-cradle, 15-20 min  Pre-feed weight: 4112g Post-feed weight: 4116g Amount transferred: 4ml L breast, 12 min  Pre-feed weight: 4116 g Post-feed weight: 4122 g Amount transferred: 6 ml L breast, cross-cradle, 8min  Total amount transferred: 44 ml  Martha is 13oz over BW at 363 weeks of age. She has gained 9 oz over the last 7 days. Mom was diagnosed w/mastitis yesterday (L breast), which may have been partly caused by not expressing her milk for 4-5 hours when having a photography session. Mom has now been on antibx for 24 hrs. She feels better & denies fever. There is a small pink area on her L breast that she noticed this morning. This pink area overlies an area where she felt sore yesterday, prior to the diagnosis of mastitis. She may have had a plugged duct forming that brought on the mastitis. Mom mentions that her L breast seems to produce more than her R breast. Mom's nipples are atraumatic & are noted to be invaginated. Mom has been wearing "milkies" for the last 2 weeks b/c of leaking from the opposite breast when feeding.   Infant latched w/ease & mom has no complaints of soreness w/breastfeeding. Infant's tongue noted to extend appropriately over lower gum line when nursing.   Mom mentions having had some anxiety that prevented her from falling asleep previously. However, she has begun drinking chamomile tea, which she has found helpful.   I have no concerns  about this dyad. Mom knows she can express her milk if she becomes uncomfortably full and she can use warm, moist heat on L breast when feeding to expedite removal of possible plugged duct.   Glenetta HewKim Christella App, RN, IBCLC

## 2018-02-08 ENCOUNTER — Emergency Department (HOSPITAL_COMMUNITY): Admission: EM | Admit: 2018-02-08 | Discharge: 2018-02-09 | Disposition: A | Discharge status: Home / Self Care

## 2018-12-11 LAB — OB RESULTS CONSOLE ABO/RH: RH Type: POSITIVE

## 2018-12-11 LAB — OB RESULTS CONSOLE RUBELLA ANTIBODY, IGM: Rubella: IMMUNE

## 2018-12-11 LAB — OB RESULTS CONSOLE HIV ANTIBODY (ROUTINE TESTING): HIV: NONREACTIVE

## 2018-12-11 LAB — OB RESULTS CONSOLE GC/CHLAMYDIA
Chlamydia: NEGATIVE
Gonorrhea: NEGATIVE

## 2018-12-11 LAB — OB RESULTS CONSOLE HEPATITIS B SURFACE ANTIGEN: Hepatitis B Surface Ag: NEGATIVE

## 2018-12-11 LAB — OB RESULTS CONSOLE ANTIBODY SCREEN: Antibody Screen: NEGATIVE

## 2018-12-11 LAB — OB RESULTS CONSOLE RPR: RPR: NONREACTIVE

## 2019-03-25 ENCOUNTER — Encounter (HOSPITAL_COMMUNITY): Payer: Self-pay

## 2019-03-27 ENCOUNTER — Other Ambulatory Visit (HOSPITAL_COMMUNITY): Payer: Self-pay | Admitting: Obstetrics & Gynecology

## 2019-03-27 DIAGNOSIS — Z3A19 19 weeks gestation of pregnancy: Secondary | ICD-10-CM

## 2019-03-27 DIAGNOSIS — O283 Abnormal ultrasonic finding on antenatal screening of mother: Secondary | ICD-10-CM

## 2019-03-27 DIAGNOSIS — Z363 Encounter for antenatal screening for malformations: Secondary | ICD-10-CM

## 2019-03-30 ENCOUNTER — Encounter (HOSPITAL_COMMUNITY): Payer: Self-pay | Admitting: Radiology

## 2019-03-30 ENCOUNTER — Ambulatory Visit (HOSPITAL_BASED_OUTPATIENT_CLINIC_OR_DEPARTMENT_OTHER): Admitting: Genetic Counselor

## 2019-03-30 ENCOUNTER — Ambulatory Visit (HOSPITAL_BASED_OUTPATIENT_CLINIC_OR_DEPARTMENT_OTHER): Admitting: Obstetrics

## 2019-03-30 ENCOUNTER — Other Ambulatory Visit: Payer: Self-pay

## 2019-03-30 ENCOUNTER — Ambulatory Visit (HOSPITAL_COMMUNITY): Admitting: *Deleted

## 2019-03-30 ENCOUNTER — Other Ambulatory Visit (HOSPITAL_COMMUNITY): Payer: Self-pay | Admitting: *Deleted

## 2019-03-30 ENCOUNTER — Ambulatory Visit (HOSPITAL_COMMUNITY): Payer: Self-pay | Admitting: Obstetrics & Gynecology

## 2019-03-30 ENCOUNTER — Ambulatory Visit (HOSPITAL_COMMUNITY)
Admission: RE | Admit: 2019-03-30 | Discharge: 2019-03-30 | Disposition: A | Discharge status: Home / Self Care | Source: Ambulatory Visit | Attending: Obstetrics & Gynecology | Admitting: Obstetrics & Gynecology

## 2019-03-30 VITALS — BP 114/71 | HR 133 | Temp 98.1°F | Wt 130.4 lb

## 2019-03-30 DIAGNOSIS — O24410 Gestational diabetes mellitus in pregnancy, diet controlled: Secondary | ICD-10-CM

## 2019-03-30 DIAGNOSIS — Z3A19 19 weeks gestation of pregnancy: Secondary | ICD-10-CM | POA: Diagnosis not present

## 2019-03-30 DIAGNOSIS — O358XX0 Maternal care for other (suspected) fetal abnormality and damage, not applicable or unspecified: Secondary | ICD-10-CM | POA: Insufficient documentation

## 2019-03-30 DIAGNOSIS — Z363 Encounter for antenatal screening for malformations: Secondary | ICD-10-CM | POA: Insufficient documentation

## 2019-03-30 DIAGNOSIS — O09292 Supervision of pregnancy with other poor reproductive or obstetric history, second trimester: Secondary | ICD-10-CM | POA: Diagnosis not present

## 2019-03-30 DIAGNOSIS — O283 Abnormal ultrasonic finding on antenatal screening of mother: Secondary | ICD-10-CM

## 2019-03-30 DIAGNOSIS — O35EXX0 Maternal care for other (suspected) fetal abnormality and damage, fetal genitourinary anomalies, not applicable or unspecified: Secondary | ICD-10-CM

## 2019-03-30 DIAGNOSIS — O359XX0 Maternal care for (suspected) fetal abnormality and damage, unspecified, not applicable or unspecified: Secondary | ICD-10-CM

## 2019-03-30 DIAGNOSIS — R9389 Abnormal findings on diagnostic imaging of other specified body structures: Secondary | ICD-10-CM

## 2019-03-30 DIAGNOSIS — Z315 Encounter for genetic counseling: Secondary | ICD-10-CM | POA: Diagnosis not present

## 2019-03-30 DIAGNOSIS — O350XX0 Maternal care for (suspected) central nervous system malformation in fetus, not applicable or unspecified: Secondary | ICD-10-CM | POA: Diagnosis not present

## 2019-03-30 DIAGNOSIS — O3503X0 Maternal care for (suspected) central nervous system malformation or damage in fetus, choroid plexus cysts, not applicable or unspecified: Secondary | ICD-10-CM

## 2019-03-30 NOTE — Progress Notes (Signed)
This patient was seen for a detailed fetal anatomy scan as bilateral choroid plexus cysts, an echogenic focus, bilateral pyelectasis, and possible ventriculomegaly were noted on an ultrasound performed in your office.  The patient reports a history of gestational diabetes in her prior pregnancy.  She reports that she was screened early for gestational diabetes in her current pregnancy and she failed the test by a few points.  She is planning on performing daily fingersticks to monitor her blood glucose levels. She denies any other significant past medical history.   She had a quad screen which indicated a Down syndrome risk of 1:2728 and a trisomy 18 risk of 1:2523.  An MSAFP was 1.28 MoM.  The patient reports that she had a cell free DNA test drawn last week once these ultrasound findings were noted.  The results of her cell free DNA test is currently pending.  She was informed that the fetal growth and amniotic fluid level were appropriate for her gestational age.   On today's exam, bilateral choroid plexus cysts were noted in the fetal brain.  The implications and management of choroid plexus cysts were discussed today.  She was advised that the choroid plexus cysts are most likely normal variants and will usually resolve at around 24 weeks.  The small association of bilateral choroid plexus cysts with trisomy 18 was discussed.  She was advised that based on the results of her quad screen, it is unlikely that her baby will have trisomy 43.    An intracardiac echogenic focus was noted in the left ventricle of the fetal heart.  The small association between an echogenic focus and Down syndrome was discussed.   Mild bilateral pyelectasis was noted on today's ultrasound exam.  The patient was advised that the mild bilateral pylectasis noted today is most likely a normal variant.  However, there may also be other processes causing this finding that may require treatment after birth. The association between  pyelectasis and Down syndrome was discussed.    There were no signs of ventriculomegaly noted today.  The lateral ventricles may appear to measure larger due to the large choroid plexus cysts.  The patient was informed that anomalies may be missed due to technical limitations. If the fetus is in a suboptimal position or maternal habitus is increased, visualization of the fetus in the maternal uterus may be impaired.  The patient was advised that despite the three minor markers associated with fetal aneuploidy that were noted on today's ultrasound, there is still a high likelihood that her baby is genetically normal based on the low risk for trisomy 18 and 21 indicated on her quad screen.  She was offered and declined an amniocentesis today for definitive diagnosis of fetal aneuploidy.  She will await the results of her cell free DNA test.  The patient also met with our genetic counselor today to discuss the significance of these ultrasound findings.  A follow-up exam was scheduled in our office in 5 weeks to reassess the bilateral choroid plexus cysts and the bilateral pyelectasis noted today.  She should be referred back to our office for consultation prior to 5 weeks should her cell free DNA test indicate an increased risk for fetal aneuploidy.   A total of 30 minutes was spent counseling and coordinating the care for this patient.  Greater than 50% of the time was spent in direct face-to-face contact.

## 2019-03-30 NOTE — Progress Notes (Signed)
03/30/2019  Martha Gutierrez 21-Sep-1990 MRN: [id number] DOV: 03/30/2019  Marth[facility] Gutierrez [facility] for a genetics consultation regarding soft markers for chromosomal aneuploidy that were identified on ultrasound. Martha Gutierrez came to her appointment alone due to COVID-19 visitor restrictions.   Indication for genetic counseling - Soft markers for aneuploidy identified on ultrasound  Prenatal history  Martha Gutierrez is a 42P1001, 28 y.o. female. Her current pregnancy has completed [id number] (Estimated Date of Delivery: 08/18/19).  Martha Gutierrez denied exposure to environmental toxins or chemical agents. She denied the use of alcohol, tobacco or street drugs. She reported taking prenatal vitamins, Benadryl, and Tylenol. She denied significant viral illnesses, fevers, and bleeding during the course of her pregnancy. Her medical and surgical histories were noncontributory.  Family History  A three generation pedigree was drafted and reviewed. The family history is remarkable for the following:  - Martha Gutierrez's mother had Burkitt lymphoma diagnosed at the age of 30. Martha Gutierrez's husband, Martha Gutierrez, had a brother who died at 79 years of age due to a brain tumor. Martha Gutierrez could not remember what specific type of brain tumor this individual had. Though most cancers and tumors are thought to be sporadic or due to environmental factors, some families appear to have a strong predisposition to cancers.  When considering a family history of cancer, we look for common types of cancer in multiple family members occurring at younger than typical ages. We discussed that many times, lymphoma and childhood brain tumors happen due to chance and are not associated with an inherited cause. However, we also discussed the option of meeting with a cancer genetic counselor to discuss any possible screening or testing options available. If the couple is concerned about the family histories  of cancer and would like to learn more about their families' chances for an inherited cancer syndrome, their healthcare providers may refer them or their relatives to the [facility] [phone]).   The remaining family histories were reviewed and found to be noncontributory for birth defects, intellectual disability, recurrent pregnancy loss, and known genetic conditions.    The patient's ethnicity is [place], [place], [place], and [place]. The father of the pregnancy's ethnicity is [place], [place], and [place]. Ashkenazi Jewish ancestry and consanguinity were denied. Pedigree will be scanned under Media.  Discussion  Martha Gutierrez was seen for genetic counseling due to the presence of multiple soft markers associated with fetal aneuploidyon ultrasound. On Ms. Rober MinionKetrow'sdetailed anatomy ultrasound performed today, bilateral choroid plexus cysts, pyelectasis, and an echogenic intracardiac focus were noted. The ultrasound reportwill be documented separately.  We discussed that the second trimesteranatomy ultrasoundis targeted at identifying congenital birth defects andfeatures associated with aneuploidy. It has evolved as a screening tool used to provide an individualized risk assessment for Down syndrome and other trisomies. The ability ofultrasoundto aid in the detection of aneuploidies relies on identification of both major structural anomalies and "soft markers."The term "soft markers"refers to findings that are often normal variants and do not cause any significant medical problems for a baby postnatally. Nonetheless, softmarkers, including choroid plexus cysts, pyelectasis, and echogenic intracardiac foci have known associationswith aneuploidy.   The choroid plexus is an area in the brain where cerebral spinal fluid, the fluid that bathes the brain and spinal cord, is made. Cysts, or fluid filled sacs, are sometimes found in the choroid plexus of babies both before and after  they are born. Martha Gutierrez was counseled that approximately  1-2.5% of pregnancies evaluated by ultrasound will show choroid plexus cyst (CPCs). Literature suggests that CPCs are an ultrasound finding in approximately 30-50% of fetuses with trisomy 66. Martha Gutierrez was counseled that when a patient has other risk factors for fetal trisomy 18 including other ultrasound findings, CPCs are associated with an increased risk for fetal trisomy 36, with a likelihood ratio of 9.51 (Sahinoglu et al., 2004). CPCs are not associated with an increased risk for fetal Down syndrome.  Pyelectasis, or urinary tract dilation (UTD) is a mild enlargement of the central area, or "pelvis" of the kidney. The increase in size may be the result of urine not being able to flow freely from the kidney to the bladder due to ureteropelvic junction obstruction. Urine can also back up from the bladder into the kidneysand cause dilation; this is known as vesicoureteral reflux.Martha Gutierrez counseled that pyelectasis is considered a softmarker for Down syndrome. It is present in approximately 1-2% of chromosomally normal fetuses, but up to17% of fetuses with Down syndrome. The likelihood ratiofor fetal Down syndromeassociated with pyelectasis is ~1.5 (Nyberg et al., 2001).  An echogenic intracardiac focus (EIF) is a bright spot seen in the heart on ultrasound. EIF is considered a soft marker for Down syndrome. EIF is present in approximately 3-5% of chromosomally normal fetuses, but up to 30% of fetuses with Down syndrome. The likelihood ratio for fetal Down syndrome associated with EIF is 5.83 (Agathokleous et al., 2013). We discussed that while it is possible that the combination of CPCs, pyelectasis, and EIF could be related, these findings may also be independent of one another.  Martha Gutierrez previously had quad screening performed which was negative. We reviewed that the risk for her pregnancy to be affected by Down syndrome decreased  from her 1 in 648 age-related risk to 1 in 2728, and the risk for trisomy 18 was not increased over her 1 in 2523 age-related risk based on the results of this screen. Additionally, the risk for open neural tube defects (ONTDs) was low (1 in 5096). Given her screening-related risk to have a child with Down syndrome or trisomy 93 and the findings of CPCs, pyelectasis, and EIF on ultrasound, Martha Gutierrez adjusted risk for the fetus to be affected with Down syndrome is 0.3%, and the adjusted risk for the fetus to be affected with trisomy 18 is 0.4%.  We reviewed noninvasive prenatal screening (NIPS) as an additional available screening option to further refine the risk of chromosomal aneuploidy for the pregnancy. Specifically, we discussed that NIPS analyzes cell free DNA originating from the placenta that is found in the maternal blood circulation during pregnancy. This test is not diagnostic for chromosome conditions, but can provide information regarding the presence or absence of extra fetal DNA for chromosomes 13, 18, 21, and the sex chromosomes. Thus, it would not identify or rule out all fetal aneuploidy. The reported detection rate is higher than that of quad screening, with NIPS identifying 91-99% of cases of trisomies 21, 18, 13, and sex chromosome aneuploidies. The false positive rate associated with NIPS is reported to be less than 0.1% for any of these conditions. Martha Gutierrez had Panorama NIPS drawn by her OBGYN provider on 12/9; results are still pending.  While Martha Gutierrez negative quad screen results significantly reduced the likelihood of the pregnancy being affected by trisomy 21,trisomy 18, or ONTDs,it cannot be considered diagnostic. Martha Gutierrez was counseled regarding diagnostic testing via amniocentesis. We discussed the technical aspects of the procedure and  quoted up to a 1 in 500 (0.2%) risk for spontaneous pregnancy loss or other adverse pregnancy outcomes as a result of amniocentesis.  Cultured cells from an amniocentesis sample allow for the visualization of a fetal karyotype, which can detect >99% of chromosomal aberrations. Chromosomal microarray can also be performed to identify smaller deletions or duplications of fetal chromosomal material. After careful consideration, Martha Gutierrez declined amniocentesis at this time. She understands that amniocentesis is available at any point after 16 weeks of pregnancy and that she may opt to undergo the procedure at a later date should she change her mind.  Finally, per ACOG recommendation, carrier screening for hemoglobinopathies, cystic fibrosis (CF) and spinal muscular atrophy (SMA) was discussed including information about the conditions, rationale for testing, autosomal recessive inheritance, and the option of prenatal diagnosis. I offered carrier screening for CF, SMA, and hemoglobinopathies, which Ms. Dutkiewicz indicated that she may be interested in pursuing. Based on pan-ethnic carrier frequencies, Ms. Matsuoka risk to be a carrier of CF is 1 in 65. Her risk to be a carrier of SMA is up to 1 in 82. Her risk to be a carrier of HBB-related hemoglobinopathies is 1 in 55. She was informed that select hemoglobinopathies and CF are included on [place]'s newborn screen, but that SMA currently is not included. We discussed the Early Check research study to add SMA to her baby's newborn screening panel. Ms. Centner indicated that she was interested in pursuing this, so she was given written information on how to enroll in the Early Check study.   Additional testing was declined today. Ms. Smigelski wished to wait for her NIPS results to be returned before making any further testing decisions. I encouraged her to contact her OBGYN provider if she has not heard about her results by this Friday. I also encouraged her to contact me if her NIPS result is abnormal so that we could have a further discussion about her results and additional testing options.  Ms. Shimko also expressed interest in undergoing carrier screening. However, she wanted to focus on awaiting her NIPS result and wait to pursue carrier screening until that result is returned. We discussed that if she is still interested in undergoing carrier screening at a later date, I would be happy to facilitate the sample collection and testing process.  I counseled Ms. Schabel regarding the above risks and available options. The approximate face-to-face time with the genetic counselor was 35 minutes.  In summary:  Discussed ultrasound findings of CPCs, pyelectasis, and EIF and options for follow-up testing  All findings are soft markers for chromosomal aneuploidy (trisomies 18 and 21)  Had negative quad screen which reduced risk for Down syndrome, trisomy 18, and ONTDs in the pregnancy  NIPS drawn 12/9 by OBGYN provider; results still pending. Will contact me if abnormal  Declined amniocentesis  Discussed carrier screening for cystic fibrosis, spinal muscular atrophy, and hemoglobinopathies  Desires carrier screening once NIPS results have been returned. She may contact me when she is ready and I will facilitate testing  Reviewed family history concerns   Buelah Manis, [place] Counselor

## 2019-04-17 NOTE — L&D Delivery Note (Signed)
Delivery Note At 12:59 AM a viable and healthy female was delivered via Vaginal, Spontaneous (Presentation: Left Occiput Anterior).  APGAR: 9, 9; weight  .   Placenta status: Spontaneous, Intact.  Cord: 3 vessels with the following complications: Loose nuchal cord.  Cord pH: NA  Anesthesia: Epidural Episiotomy: None Lacerations: 2nd degree;Perineal Suture Repair: 3.0 vicryl rapide Est. Blood Loss (mL):  300  Mom to postpartum.  Baby to Couplet care / Skin to Skin.  Robley Fries 08/20/2019, 1:18 AM

## 2019-05-04 ENCOUNTER — Ambulatory Visit (HOSPITAL_COMMUNITY): Admitting: *Deleted

## 2019-05-04 ENCOUNTER — Ambulatory Visit (HOSPITAL_COMMUNITY)
Admission: RE | Admit: 2019-05-04 | Discharge: 2019-05-04 | Disposition: A | Discharge status: Home / Self Care | Source: Ambulatory Visit | Attending: Obstetrics and Gynecology | Admitting: Obstetrics and Gynecology

## 2019-05-04 ENCOUNTER — Encounter (HOSPITAL_COMMUNITY): Payer: Self-pay | Admitting: *Deleted

## 2019-05-04 ENCOUNTER — Other Ambulatory Visit: Payer: Self-pay

## 2019-05-04 ENCOUNTER — Other Ambulatory Visit (HOSPITAL_COMMUNITY): Payer: Self-pay | Admitting: *Deleted

## 2019-05-04 VITALS — BP 117/64 | HR 94 | Temp 97.9°F

## 2019-05-04 DIAGNOSIS — O35EXX0 Maternal care for other (suspected) fetal abnormality and damage, fetal genitourinary anomalies, not applicable or unspecified: Secondary | ICD-10-CM

## 2019-05-04 DIAGNOSIS — Z362 Encounter for other antenatal screening follow-up: Secondary | ICD-10-CM | POA: Diagnosis not present

## 2019-05-04 DIAGNOSIS — R9389 Abnormal findings on diagnostic imaging of other specified body structures: Secondary | ICD-10-CM | POA: Diagnosis not present

## 2019-05-04 DIAGNOSIS — O358XX0 Maternal care for other (suspected) fetal abnormality and damage, not applicable or unspecified: Secondary | ICD-10-CM

## 2019-05-04 DIAGNOSIS — O283 Abnormal ultrasonic finding on antenatal screening of mother: Secondary | ICD-10-CM

## 2019-05-04 DIAGNOSIS — O09292 Supervision of pregnancy with other poor reproductive or obstetric history, second trimester: Secondary | ICD-10-CM | POA: Diagnosis not present

## 2019-05-04 DIAGNOSIS — Z3A24 24 weeks gestation of pregnancy: Secondary | ICD-10-CM | POA: Diagnosis not present

## 2019-06-03 ENCOUNTER — Other Ambulatory Visit: Payer: Self-pay

## 2019-06-03 ENCOUNTER — Other Ambulatory Visit (HOSPITAL_COMMUNITY): Payer: Self-pay | Admitting: *Deleted

## 2019-06-03 ENCOUNTER — Ambulatory Visit (HOSPITAL_COMMUNITY)
Admission: RE | Admit: 2019-06-03 | Discharge: 2019-06-03 | Disposition: A | Discharge status: Home / Self Care | Source: Ambulatory Visit | Attending: Obstetrics and Gynecology | Admitting: Obstetrics and Gynecology

## 2019-06-03 ENCOUNTER — Ambulatory Visit (HOSPITAL_COMMUNITY): Admitting: *Deleted

## 2019-06-03 ENCOUNTER — Encounter (HOSPITAL_COMMUNITY): Payer: Self-pay

## 2019-06-03 VITALS — BP 113/68 | HR 101 | Temp 97.7°F

## 2019-06-03 DIAGNOSIS — Z3A29 29 weeks gestation of pregnancy: Secondary | ICD-10-CM | POA: Diagnosis not present

## 2019-06-03 DIAGNOSIS — O35EXX0 Maternal care for other (suspected) fetal abnormality and damage, fetal genitourinary anomalies, not applicable or unspecified: Secondary | ICD-10-CM

## 2019-06-03 DIAGNOSIS — Z362 Encounter for other antenatal screening follow-up: Secondary | ICD-10-CM

## 2019-06-03 DIAGNOSIS — O358XX0 Maternal care for other (suspected) fetal abnormality and damage, not applicable or unspecified: Secondary | ICD-10-CM | POA: Diagnosis present

## 2019-06-09 ENCOUNTER — Other Ambulatory Visit (HOSPITAL_COMMUNITY): Payer: Self-pay | Admitting: Obstetrics & Gynecology

## 2019-07-15 ENCOUNTER — Ambulatory Visit (HOSPITAL_COMMUNITY)

## 2019-07-15 ENCOUNTER — Encounter (HOSPITAL_COMMUNITY): Payer: Self-pay

## 2019-07-16 LAB — OB RESULTS CONSOLE GBS: GBS: POSITIVE

## 2019-08-12 ENCOUNTER — Telehealth (HOSPITAL_COMMUNITY): Payer: Self-pay | Admitting: *Deleted

## 2019-08-12 NOTE — Telephone Encounter (Signed)
Preadmission screen  

## 2019-08-13 ENCOUNTER — Encounter (HOSPITAL_COMMUNITY): Payer: Self-pay | Admitting: *Deleted

## 2019-08-17 ENCOUNTER — Other Ambulatory Visit (HOSPITAL_COMMUNITY)
Admission: RE | Admit: 2019-08-17 | Discharge: 2019-08-17 | Disposition: A | Discharge status: Home / Self Care | Source: Ambulatory Visit | Attending: Obstetrics & Gynecology | Admitting: Obstetrics & Gynecology

## 2019-08-17 ENCOUNTER — Other Ambulatory Visit: Payer: Self-pay | Admitting: Obstetrics & Gynecology

## 2019-08-17 LAB — SARS CORONAVIRUS 2 (TAT 6-24 HRS): SARS Coronavirus 2: NEGATIVE

## 2019-08-19 ENCOUNTER — Inpatient Hospital Stay (HOSPITAL_COMMUNITY): Admitting: Anesthesiology

## 2019-08-19 ENCOUNTER — Other Ambulatory Visit: Payer: Self-pay

## 2019-08-19 ENCOUNTER — Encounter (HOSPITAL_COMMUNITY): Payer: Self-pay | Admitting: Obstetrics & Gynecology

## 2019-08-19 ENCOUNTER — Inpatient Hospital Stay (HOSPITAL_COMMUNITY)
Admission: AD | Admit: 2019-08-19 | Discharge: 2019-08-21 | DRG: 807 | Disposition: A | Discharge status: Home / Self Care | Attending: Obstetrics & Gynecology | Admitting: Obstetrics & Gynecology

## 2019-08-19 ENCOUNTER — Inpatient Hospital Stay (HOSPITAL_COMMUNITY)

## 2019-08-19 DIAGNOSIS — Y92231 Patient bathroom in hospital as the place of occurrence of the external cause: Secondary | ICD-10-CM | POA: Diagnosis not present

## 2019-08-19 DIAGNOSIS — O358XX0 Maternal care for other (suspected) fetal abnormality and damage, not applicable or unspecified: Secondary | ICD-10-CM | POA: Diagnosis present

## 2019-08-19 DIAGNOSIS — O3663X0 Maternal care for excessive fetal growth, third trimester, not applicable or unspecified: Secondary | ICD-10-CM | POA: Diagnosis present

## 2019-08-19 DIAGNOSIS — Z3A40 40 weeks gestation of pregnancy: Secondary | ICD-10-CM

## 2019-08-19 DIAGNOSIS — Z3490 Encounter for supervision of normal pregnancy, unspecified, unspecified trimester: Secondary | ICD-10-CM

## 2019-08-19 DIAGNOSIS — O99824 Streptococcus B carrier state complicating childbirth: Secondary | ICD-10-CM | POA: Diagnosis present

## 2019-08-19 DIAGNOSIS — W182XXA Fall in (into) shower or empty bathtub, initial encounter: Secondary | ICD-10-CM | POA: Diagnosis not present

## 2019-08-19 DIAGNOSIS — Z20822 Contact with and (suspected) exposure to covid-19: Secondary | ICD-10-CM | POA: Diagnosis present

## 2019-08-19 DIAGNOSIS — Y93F1 Activity, caregiving, bathing: Secondary | ICD-10-CM

## 2019-08-19 HISTORY — DX: Encounter for supervision of normal pregnancy, unspecified, unspecified trimester: Z34.90

## 2019-08-19 LAB — TYPE AND SCREEN
ABO/RH(D): O POS
Antibody Screen: NEGATIVE

## 2019-08-19 LAB — CBC
HCT: 38.5 % (ref 36.0–46.0)
Hemoglobin: 13.2 g/dL (ref 12.0–15.0)
MCH: 30.8 pg (ref 26.0–34.0)
MCHC: 34.3 g/dL (ref 30.0–36.0)
MCV: 89.7 fL (ref 80.0–100.0)
Platelets: 302 10*3/uL (ref 150–400)
RBC: 4.29 MIL/uL (ref 3.87–5.11)
RDW: 13.9 % (ref 11.5–15.5)
WBC: 7.7 10*3/uL (ref 4.0–10.5)
nRBC: 0 % (ref 0.0–0.2)

## 2019-08-19 LAB — RPR: RPR Ser Ql: NONREACTIVE

## 2019-08-19 LAB — ABO/RH: ABO/RH(D): O POS

## 2019-08-19 LAB — GLUCOSE, CAPILLARY: Glucose-Capillary: 88 mg/dL (ref 70–99)

## 2019-08-19 MED ORDER — OXYTOCIN BOLUS FROM INFUSION
500.0000 mL | Freq: Once | INTRAVENOUS | Status: AC
  Administered 2019-08-20: 500 mL via INTRAVENOUS

## 2019-08-19 MED ORDER — ACETAMINOPHEN 325 MG PO TABS: 650.0000 mg | ORAL_TABLET | ORAL | Status: DC | PRN

## 2019-08-19 MED ORDER — EPHEDRINE 5 MG/ML INJ: 10.0000 mg | INTRAVENOUS | Status: DC | PRN

## 2019-08-19 MED ORDER — LIDOCAINE HCL (PF) 1 % IJ SOLN
INTRAMUSCULAR | Status: DC | PRN
  Administered 2019-08-19: 11 mL via EPIDURAL

## 2019-08-19 MED ORDER — LACTATED RINGERS IV SOLN: INTRAVENOUS | Status: DC

## 2019-08-19 MED ORDER — LACTATED RINGERS IV SOLN: 500.0000 mL | Freq: Once | INTRAVENOUS | Status: DC

## 2019-08-19 MED ORDER — FENTANYL-BUPIVACAINE-NACL 0.5-0.125-0.9 MG/250ML-% EP SOLN: 12.0000 mL/h | EPIDURAL | Status: DC | PRN

## 2019-08-19 MED ORDER — OXYTOCIN 40 UNITS IN NORMAL SALINE INFUSION - SIMPLE MED: 1.0000 m[IU]/min | INTRAVENOUS | Status: DC

## 2019-08-19 MED ORDER — PHENYLEPHRINE 40 MCG/ML (10ML) SYRINGE FOR IV PUSH (FOR BLOOD PRESSURE SUPPORT): 80.0000 ug | PREFILLED_SYRINGE | INTRAVENOUS | Status: DC | PRN

## 2019-08-19 MED ORDER — PENICILLIN G POT IN DEXTROSE 60000 UNIT/ML IV SOLN
3.0000 10*6.[IU] | INTRAVENOUS | Status: DC
  Administered 2019-08-19 (×3): 3 10*6.[IU] via INTRAVENOUS
  Filled 2019-08-19 (×3): qty 50

## 2019-08-19 MED ORDER — DIPHENHYDRAMINE HCL 50 MG/ML IJ SOLN: 12.5000 mg | INTRAMUSCULAR | Status: DC | PRN

## 2019-08-19 MED ORDER — SOD CITRATE-CITRIC ACID 500-334 MG/5ML PO SOLN: 30.0000 mL | ORAL | Status: DC | PRN

## 2019-08-19 MED ORDER — SODIUM CHLORIDE (PF) 0.9 % IJ SOLN
INTRAMUSCULAR | Status: DC | PRN
  Administered 2019-08-19: 12 mL/h via EPIDURAL

## 2019-08-19 MED ORDER — SODIUM CHLORIDE 0.9 % IV SOLN
5.0000 10*6.[IU] | Freq: Once | INTRAVENOUS | Status: AC
  Administered 2019-08-19: 5 10*6.[IU] via INTRAVENOUS
  Filled 2019-08-19: qty 5

## 2019-08-19 MED ORDER — MISOPROSTOL 25 MCG QUARTER TABLET
25.0000 ug | ORAL_TABLET | ORAL | Status: DC | PRN
  Administered 2019-08-19: 25 ug via VAGINAL
  Filled 2019-08-19: qty 1

## 2019-08-19 MED ORDER — OXYTOCIN 40 UNITS IN NORMAL SALINE INFUSION - SIMPLE MED
1.0000 m[IU]/min | INTRAVENOUS | Status: DC
  Administered 2019-08-19 (×2): 2 m[IU]/min via INTRAVENOUS

## 2019-08-19 MED ORDER — PHENYLEPHRINE 40 MCG/ML (10ML) SYRINGE FOR IV PUSH (FOR BLOOD PRESSURE SUPPORT)
80.0000 ug | PREFILLED_SYRINGE | INTRAVENOUS | Status: DC | PRN
  Filled 2019-08-19: qty 10

## 2019-08-19 MED ORDER — ONDANSETRON HCL 4 MG/2ML IJ SOLN: 4.0000 mg | Freq: Four times a day (QID) | INTRAMUSCULAR | Status: DC | PRN

## 2019-08-19 MED ORDER — TERBUTALINE SULFATE 1 MG/ML IJ SOLN: 0.2500 mg | Freq: Once | INTRAMUSCULAR | Status: DC | PRN

## 2019-08-19 MED ORDER — OXYTOCIN 40 UNITS IN NORMAL SALINE INFUSION - SIMPLE MED
2.5000 [IU]/h | INTRAVENOUS | Status: DC
  Filled 2019-08-19: qty 1000

## 2019-08-19 MED ORDER — LIDOCAINE HCL (PF) 1 % IJ SOLN
30.0000 mL | INTRAMUSCULAR | Status: AC | PRN
  Administered 2019-08-20: 30 mL via SUBCUTANEOUS
  Filled 2019-08-19: qty 30

## 2019-08-19 MED ORDER — LACTATED RINGERS IV SOLN
500.0000 mL | INTRAVENOUS | Status: DC | PRN
  Administered 2019-08-19: 1000 mL via INTRAVENOUS

## 2019-08-19 NOTE — Progress Notes (Signed)
Martha Gutierrez is a 29 y.o. G2P1001 at [redacted]w[redacted]d by ultrasound admitted for induction of labor due to Elective at term and large baby EFW 9 lbs .  Subjective: Feeling some UCs   Objective: BP 109/68   Pulse 85   Temp 98.3 F (36.8 C) (Oral)   Resp 20   Ht 5\' 4"  (1.626 m)   Wt 70 kg   LMP 11/02/2018   BMI 26.49 kg/m  No intake/output data recorded. No intake/output data recorded.  FHT:  FHR: 150 bpm, variability: moderate,  accelerations:  Present,  decelerations:  Absent UC:   regular, every 2 minutes, pitocin at 2 mu rate after restarting at 3 pm. Will stop pitocin after AROM SVE:   Dilation: 4 Effacement (%): 50 Station: -3 Exam by:: Kobie Matkins --> controlled AROM, clear fluid, head still high and cx still posterior but well applied to head after copious clear fluid drained  Pelvis adequate   Labs: Lab Results  Component Value Date   WBC 7.7 08/19/2019   HGB 13.2 08/19/2019   HCT 38.5 08/19/2019   MCV 89.7 08/19/2019   PLT 302 08/19/2019    Assessment / Plan: Induction of labor due to term with favorable cervix and LGA suspectd,  progressing well on pitocin but stop pitocin for now and reassess pattern of UCs and progress over next hour FHT cat I after one spontaneous 3 min decel earlier at 1.30 pm Fetal Wellbeing:  Category I Pain Control:  options, desires epidural  I/D:  GBS+, PCN x 2 doses, cont per protocol Anticipated MOD:  NSVD, prepare room for shoulder dystocia  10/19/2019 08/19/2019, 4:26 PM

## 2019-08-19 NOTE — Anesthesia Preprocedure Evaluation (Signed)
Anesthesia Evaluation  Patient identified by MRN, date of birth, ID band Patient awake    Reviewed: Allergy & Precautions, H&P , NPO status , Patient's Chart, lab work & pertinent test results  History of Anesthesia Complications Negative for: history of anesthetic complications  Airway Mallampati: II  TM Distance: >3 FB Neck ROM: full    Dental no notable dental hx. (+) Teeth Intact   Pulmonary neg pulmonary ROS,    Pulmonary exam normal breath sounds clear to auscultation       Cardiovascular negative cardio ROS Normal cardiovascular exam Rhythm:regular Rate:Normal     Neuro/Psych negative neurological ROS  negative psych ROS   GI/Hepatic negative GI ROS, Neg liver ROS,   Endo/Other  negative endocrine ROSdiabetes  Renal/GU negative Renal ROS  negative genitourinary   Musculoskeletal   Abdominal   Peds  Hematology negative hematology ROS (+)   Anesthesia Other Findings   Reproductive/Obstetrics (+) Pregnancy                             Anesthesia Physical  Anesthesia Plan  ASA: II  Anesthesia Plan: Epidural   Post-op Pain Management:    Induction:   PONV Risk Score and Plan:   Airway Management Planned:   Additional Equipment:   Intra-op Plan:   Post-operative Plan:   Informed Consent: I have reviewed the patients History and Physical, chart, labs and discussed the procedure including the risks, benefits and alternatives for the proposed anesthesia with the patient or authorized representative who has indicated his/her understanding and acceptance.       Plan Discussed with:   Anesthesia Plan Comments:         Anesthesia Quick Evaluation

## 2019-08-19 NOTE — Anesthesia Procedure Notes (Signed)
Epidural Patient location during procedure: OB Start time: 08/19/2019 9:40 PM End time: 08/19/2019 9:56 PM  Staffing Anesthesiologist: Lowella Curb, MD Performed: anesthesiologist   Preanesthetic Checklist Completed: patient identified, IV checked, site marked, risks and benefits discussed, surgical consent, monitors and equipment checked, pre-op evaluation and timeout performed  Epidural Patient position: sitting Prep: ChloraPrep Patient monitoring: heart rate, cardiac monitor, continuous pulse ox and blood pressure Approach: midline Location: L2-L3 Injection technique: LOR saline  Needle:  Needle type: Tuohy  Needle gauge: 17 G Needle length: 9 cm Needle insertion depth: 4 cm Catheter type: closed end flexible Catheter size: 20 Guage Catheter at skin depth: 8 cm Test dose: negative  Assessment Events: blood not aspirated, injection not painful, no injection resistance, no paresthesia and negative IV test  Additional Notes Reason for block:procedure for pain

## 2019-08-19 NOTE — H&P (Addendum)
Martha Gutierrez is a 29 y.o. female presenting for IOL for macrosomia. G2P1001 EDC 08/18/19. Pt declined IOL sooner. Sono 4/28- 8'15" 97%, AC 94%, BPD 93%.  Prior term SVD Hx of A1GDM- 8'2"  Pt declined 3hr GTT for abn 1hr Glucola this preg, so did home testing, F and PP nl. No meds.  Anatomy sono - .abn soft markers- Bilat CPC, ventriculomegaly turned out to from large cyst and resolved at F/up with MFM.  Singe EIF- resolved at MFM sono.  Bilateral Pyelectasis persisted and will need post-natal f/up.  PANORAMA nl, Pt saw MFM after that serial growth scans, LGA from 28 wks. Stopped f/up with MFM after 28 wks, and we continued growth and f/up on markers and renal pelvis in office from 32 wks.LGA baby 39 wk Sono 4/28- 8'15" 97%, AC 94%, BPD 93%.Vx.  GBS+ . PCN in labor  . OB History    Gravida  2   Para  1   Term  1   Preterm      AB      Living  1     SAB      TAB      Ectopic      Multiple  0   Live Births  1          Past Medical History:  Diagnosis Date  . Gestational diabetes   . H/O gestational diabetes in prior pregnancy, currently pregnant    Past Surgical History:  Procedure Laterality Date  . APPENDECTOMY    . LAPAROSCOPIC APPENDECTOMY  08/18/2011   Procedure: APPENDECTOMY LAPAROSCOPIC;  Surgeon: Emelia Loron, MD;  Location: [facility];  Service: General;  Laterality: N/A;  . None    . WISDOM TOOTH EXTRACTION     Family History: family history includes Cancer in her mother. Social History:  reports that she has never smoked. She has never used smokeless tobacco. She reports previous alcohol use. She reports that she does not use drugs.     Maternal Diabetes: No Didn't do 3hr GTT but did home BS testing and wnl.  Genetic Screening: Normal QUAD and then did Panorama due to soft markers was also normal  Maternal Ultrasounds/Referrals: Isolated EIF (echogenic intracardiac focus), Isolated choroid plexus cyst, Fetal renal pyelectasis and Other:  Abn soft  markers- Bilat CPC, ventriculomegaly turned out to from large cyst and resolved at F/up with MFM, singe EIF- resolved at MFM sono. Bilateral Pyelectasis persisted and will need post-natal f/up. PANORAMA nl, Pt saw MFM after that serial growth scans, LGA from 28 wks. Stopped f/up with MFM after 28 wks, and we continued growth and f/up on markers and renal pelvis in office from 32 wks Fetal Ultrasounds or other Referrals:  Referred to Materal Fetal Medicine  Maternal Substance Abuse:  No Significant Maternal Medications:  None Significant Maternal Lab Results:  Group B Strep positive Other Comments:  None  Review of Systems History Dilation: 2(Simultaneous filing. User may not have seen previous data.) Effacement (%): (thick  Simultaneous filing. User may not have seen previous data.) Station: -Education officer, museum. User may not have seen previous data.) Exam by:: Colon Flattery, CNM(Simultaneous filing. User may not have seen previous data.) Blood pressure 109/68, pulse 77, temperature 98.1 F (36.7 C), temperature source Oral, resp. rate 20, height 5\' 4"  (1.626 m), weight 70 kg, last menstrual period 11/02/2018, unknown if currently breastfeeding. Exam Physical Exam  Physical exam:  A&O x 3, no acute distress. Pleasant HEENT neg, no thyromegaly Lungs CTA  bilat CV RRR, S1S2 normal Abdo soft, non tender, non acute Extr no edema/ tenderness Pelvic As above per CNM FHT  150s + accels no decels mod variability- cat I Toco regular q 3-4 min  Prenatal labs: ABO, Rh: --/--/O POS, O POS Performed at [facility] [address] [place], [place] [address]  [phone]) Antibody: NEG (05/05 0824) Rubella: Immune (08/27 0000) RPR: NON REACTIVE (05/05 0824)  HBsAg: Negative (08/27 0000)  HIV: Non-reactive (08/27 0000)  GBS: Positive/-- (04/01 0000)   Assessment/Plan: 29 yo G2P1001. 40.1 wks, IOL for macrosomia, EFW 9  Lbs. GBS+, PCN in labor. Pitocin IOL. Prepare for shoulder dystocia.   CNM placed cervical foley. Expelled earlier, pitocin form 1 pm if tolerates well Prior SVD 8'2"    Martha Gutierrez 08/19/2019, 12:55 PM

## 2019-08-19 NOTE — Progress Notes (Signed)
Martha Gutierrez is a 29 y.o. G2P1001 at [redacted]w[redacted]d by ultrasound admitted for scheduled IOL at term. CNM at [facility] per MD request for CB placement.   Subjective: Resting in bed. Spouse at [facility].  R/B of CB discussed and agrees.   Objective: Temp:  [97.9 F (36.6 C)] 97.9 F (36.6 C) (05/05 0813) Pulse Rate:  [100] 100 (05/05 0813) Resp:  [20] 20 (05/05 0813) BP: (120)/(68) 120/68 (05/05 0813) Weight:  [70 kg] 70 kg (05/05 0813)  FHT:  FHR: 140 bpm, variability: moderate,  accelerations:  Present,  decelerations:  Absent UC:   rare SVE:   Dilation: 2 Effacement (%): (thick) Station: -2 Exam by:: Colon Flattery, CNM  Labs:   Recent Labs    08/19/19 0824  WBC 7.7  HGB 13.2  HCT 38.5  PLT 302    Assessment / Plan: Induction of labor for LGA, cervical ripening in process S/P Cytotec x 1 dose  Labor: Cervical balloon balloon placed with ease, patient toleratedwell, 60 cc fluid infused Preeclampsia:  no signs or symptoms of toxicity Fetal Wellbeing:  Category I Pain Control:  Epidural in active labor I/D:  GBS, PCN started Anticipated MOD:  NSVD, pelvis proven to 8#3  Dr. Juliene Pina to follow  Neta Mends, CNM, MSN 08/19/2019, 9:36 AM

## 2019-08-19 NOTE — Progress Notes (Signed)
Colon Flattery CNM reviewed strip. OK'd taking EFM off

## 2019-08-19 NOTE — Progress Notes (Signed)
10 Instruments 10 Sponges 2 Injectables

## 2019-08-19 NOTE — Progress Notes (Signed)
FHR down pt attempting to get oob.  Pt back to bed fhr in the 90's

## 2019-08-20 ENCOUNTER — Encounter (HOSPITAL_COMMUNITY): Payer: Self-pay | Admitting: Obstetrics & Gynecology

## 2019-08-20 LAB — CBC
HCT: 34.9 % — ABNORMAL LOW (ref 36.0–46.0)
Hemoglobin: 11.7 g/dL — ABNORMAL LOW (ref 12.0–15.0)
MCH: 29.9 pg (ref 26.0–34.0)
MCHC: 33.5 g/dL (ref 30.0–36.0)
MCV: 89.3 fL (ref 80.0–100.0)
Platelets: 238 10*3/uL (ref 150–400)
RBC: 3.91 MIL/uL (ref 3.87–5.11)
RDW: 13.8 % (ref 11.5–15.5)
WBC: 14.8 10*3/uL — ABNORMAL HIGH (ref 4.0–10.5)
nRBC: 0 % (ref 0.0–0.2)

## 2019-08-20 MED ORDER — DIPHENHYDRAMINE HCL 25 MG PO CAPS: 25.0000 mg | ORAL_CAPSULE | Freq: Four times a day (QID) | ORAL | Status: DC | PRN

## 2019-08-20 MED ORDER — ACETAMINOPHEN 325 MG PO TABS
650.0000 mg | ORAL_TABLET | ORAL | Status: DC | PRN
  Administered 2019-08-20: 650 mg via ORAL
  Filled 2019-08-20: qty 2

## 2019-08-20 MED ORDER — PRENATAL MULTIVITAMIN CH
1.0000 | ORAL_TABLET | Freq: Every day | ORAL | Status: DC
  Administered 2019-08-20 – 2019-08-21 (×2): 1 via ORAL
  Filled 2019-08-20 (×2): qty 1

## 2019-08-20 MED ORDER — BENZOCAINE-MENTHOL 20-0.5 % EX AERO
1.0000 "application " | INHALATION_SPRAY | CUTANEOUS | Status: DC | PRN
  Filled 2019-08-20: qty 56

## 2019-08-20 MED ORDER — SIMETHICONE 80 MG PO CHEW: 80.0000 mg | CHEWABLE_TABLET | ORAL | Status: DC | PRN

## 2019-08-20 MED ORDER — ZOLPIDEM TARTRATE 5 MG PO TABS: 5.0000 mg | ORAL_TABLET | Freq: Every evening | ORAL | Status: DC | PRN

## 2019-08-20 MED ORDER — ONDANSETRON HCL 4 MG PO TABS: 4.0000 mg | ORAL_TABLET | ORAL | Status: DC | PRN

## 2019-08-20 MED ORDER — COCONUT OIL OIL: 1.0000 "application " | TOPICAL_OIL | Status: DC | PRN

## 2019-08-20 MED ORDER — SENNOSIDES-DOCUSATE SODIUM 8.6-50 MG PO TABS
2.0000 | ORAL_TABLET | ORAL | Status: DC
  Administered 2019-08-20: 2 via ORAL
  Filled 2019-08-20: qty 2

## 2019-08-20 MED ORDER — DIBUCAINE (PERIANAL) 1 % EX OINT: 1.0000 "application " | TOPICAL_OINTMENT | CUTANEOUS | Status: DC | PRN

## 2019-08-20 MED ORDER — IBUPROFEN 600 MG PO TABS
600.0000 mg | ORAL_TABLET | Freq: Four times a day (QID) | ORAL | Status: DC
  Administered 2019-08-20 – 2019-08-21 (×7): 600 mg via ORAL
  Filled 2019-08-20 (×7): qty 1

## 2019-08-20 MED ORDER — ONDANSETRON HCL 4 MG/2ML IJ SOLN: 4.0000 mg | INTRAMUSCULAR | Status: DC | PRN

## 2019-08-20 MED ORDER — TETANUS-DIPHTH-ACELL PERTUSSIS 5-2.5-18.5 LF-MCG/0.5 IM SUSP: 0.5000 mL | Freq: Once | INTRAMUSCULAR | Status: DC

## 2019-08-20 MED ORDER — WITCH HAZEL-GLYCERIN EX PADS: 1.0000 "application " | MEDICATED_PAD | CUTANEOUS | Status: DC | PRN

## 2019-08-20 NOTE — Progress Notes (Signed)
This RN assisted pt to bathroom by [facility]. While this RN was assisting pt in bathroom, pt fell at 0351. Assisted pt to ground. Pt initially fell on bottom, then pt hit head on shower. Called for assistance immediately. Pt assisted back to bed with another RN by Antony Salmon. VSS. Upon assessment, by has a quarter sized nodule on posterior right side of head. Pt c/o headache. Tylenol given.  Dr. Juliene Pina notified. Per MD, no new orders. Will continue assessments per protocol. Post fall huddle completed. Pt instructed not to get out of bed without assistance.

## 2019-08-20 NOTE — Lactation Note (Signed)
This note was copied from a baby's chart. Lactation Consultation Note  Patient Name: Martha Gutierrez EWYBR'K Date: 08/20/2019 Reason for consult: Initial assessment;Term GDM-diet controlled  LC in to visit with P2 Mom of term baby at 7 hrs old.  Baby has been spitty and not interested in latching to breast after a few short latches after delivery.  Mom is experienced breastfeeding her 1st baby for 18 months.  Mom was sleeping when LC entered the room. GMOB holding swaddled baby sleeping.  GMOB knows to awaken Mom when baby starts cueing.  Also, when Mom is done napping, she will place baby STS on her chest and call for assistance with positioning and latching baby.    Mom taught hand expression, and baby received drops of colostrum.  Spoons placed on bedside table and encouraged Mom to hand express to a spoon when she is awake.   Lactation brochure left in room.  LC will follow-up prn.  Consult Status Consult Status: Follow-up Date: 08/21/19 Follow-up type: In-patient    Martha Gutierrez 08/20/2019, 2:11 PM

## 2019-08-20 NOTE — Anesthesia Postprocedure Evaluation (Signed)
Anesthesia Post Note  Patient: [name]  Procedure(s) Performed: AN AD HOC LABOR EPIDURAL     Patient location during evaluation: Mother Baby Anesthesia Type: Epidural Level of consciousness: awake and alert Pain management: pain level controlled Vital Signs Assessment: post-procedure vital signs reviewed and stable Respiratory status: spontaneous breathing Cardiovascular status: stable Postop Assessment: no headache, no backache, adequate PO intake, patient able to bend at knees, able to ambulate, epidural receding and no apparent nausea or vomiting Anesthetic complications: no    Last Vitals:  Vitals:   08/20/19 0359 08/20/19 0451  BP: 123/78 97/63  Pulse: 97 97  Resp: 20   Temp: 36.8 C 36.8 C  SpO2: 100% 99%    Last Pain:  Vitals:   08/20/19 0451  TempSrc:   PainSc: 3    Pain Goal:                   Salome Arnt

## 2019-08-21 MED ORDER — SENNOSIDES-DOCUSATE SODIUM 8.6-50 MG PO TABS: 2.0000 | ORAL_TABLET | ORAL | 1 refills | Status: DC

## 2019-08-21 MED ORDER — ACETAMINOPHEN 325 MG PO TABS: 650.0000 mg | ORAL_TABLET | ORAL | 1 refills | Status: DC | PRN

## 2019-08-21 NOTE — Discharge Summary (Signed)
OB Discharge Summary  Patient Name: Martha Gutierrez DOB: [birth year] MRN: [id number]  Date of admission: 08/19/2019 Delivering MD: MODY, VAISHALI   Date of discharge: 08/21/2019  Admitting diagnosis: Encounter for induction of labor [Z34.90] SVD (spontaneous vaginal delivery) [O80] Intrauterine pregnancy: [redacted]w[redacted]d     Secondary diagnosis:Principal Problem:   Postpartum care following vaginal delivery (5/6) Active Problems:   SVD (spontaneous vaginal delivery)   Second-degree perineal laceration, with delivery   Encounter for induction of labor  Additional problems:fetal macrosomia     Discharge diagnosis: Term Pregnancy Delivered                                                                     Post partum procedures:none  Augmentation: AROM, Pitocin and Foley Balloon  Complications: None  Hospital course:  Induction of Labor With Vaginal Delivery   29 y.o. yo [id number] at [redacted]w[redacted]d was admitted to the hospital 08/19/2019 for induction of labor.  Indication for induction: presumed macrosomia.  Patient had an uncomplicated labor course as follows: Membrane Rupture Time/Date: 4:06 PM ,08/19/2019   Intrapartum Procedures: Episiotomy: None [1]                                         Lacerations:  2nd degree [3];Perineal [11]  Patient had delivery of a Viable infant.  Information for the patient's newborn:  Lyndsi, Altic [[id number]]      08/20/2019  Details of delivery can be found in separate delivery note.  Patient had a routine postpartum course. Patient is discharged home 08/21/19.  No dizziness on standing, stable walking (initial fall due to weakness from epidural). Pt notes normally with low bp.   Physical exam  Vitals:   08/20/19 1237 08/20/19 1728 08/20/19 2158 08/21/19 0558  BP: 103/68 96/69 (!) 91/50 (!) 92/55  Pulse: 91 87 88 74  Resp: 16 16 16 18   Temp: 97.7 F (36.5 C) 98.4 F (36.9 C) 98.5 F (36.9 C) 98.3 F (36.8 C)  TempSrc: Oral Oral Axillary Oral   SpO2: 100% 99% 100%   Weight:      Height:       General: alert, cooperative and no distress Lochia: appropriate Uterine Fundus: firm Incision:n/a DVT Evaluation: No evidence of DVT seen on physical exam. Labs: Lab Results  Component Value Date   WBC 14.8 (H) 08/20/2019   HGB 11.7 (L) 08/20/2019   HCT 34.9 (L) 08/20/2019   MCV 89.3 08/20/2019   PLT 238 08/20/2019   CMP Latest Ref Rng & Units 08/18/2011  Glucose 70 - 99 mg/dL 84  BUN 6 - 23 mg/dL 9  Creatinine 0.50 - 1.10 mg/dL 0.65  Sodium 135 - 145 mEq/L 135  Potassium 3.5 - 5.1 mEq/L 3.4(L)  Chloride 96 - 112 mEq/L 100  CO2 19 - 32 mEq/L 25  Calcium 8.4 - 10.5 mg/dL 8.9  Total Protein 6.0 - 8.3 g/dL 7.1  Total Bilirubin 0.3 - 1.2 mg/dL 0.3  Alkaline Phos 39 - 117 U/L 52  AST 0 - 37 U/L 13  ALT 0 - 35 U/L 10    Discharge instruction: per After Visit  Summary and "Baby and Me Booklet".  After Visit Meds:    Diet: routine diet  Activity: Advance as tolerated. Pelvic rest for 6 weeks.   Outpatient follow up:6 weeks Follow up Appt:No future appointments. Follow up visit: No follow-ups on file.  Postpartum contraception: Not Discussed  Newborn Data: Live born female  Birth Weight: 8 lb 12.6 oz (3986 g) APGAR: 9, 9  Newborn Delivery   Birth date/time: 08/20/2019 00:59:00 Delivery type: Vaginal, Spontaneous      Baby Feeding: Breast Disposition:home with mother   08/21/2019 Lendon Colonel, MD

## 2019-08-21 NOTE — Lactation Note (Signed)
This note was copied from a baby's chart. Lactation Consultation Note  Patient Name: Martha Gutierrez NTBHG'R Date: 08/21/2019 Reason for consult: Follow-up assessment Baby is 36 hours old/6% weight loss.  Mom reports that feedings are going well.  Baby recently returned from circumcision and he is sleeping on mom's chest.  Discussed milk coming to volume and the prevention and treatment of engorgement. Mom has a DEBP at home.  Questions answered.  Reviewed outpatient services and encouraged to call prn.  Maternal Data    Feeding Feeding Type: Breast Fed  LATCH Score Latch: Grasps breast easily, tongue down, lips flanged, rhythmical sucking.  Audible Swallowing: A few with stimulation  Type of Nipple: Everted at rest and after stimulation  Comfort (Breast/Nipple): Soft / non-tender  Hold (Positioning): Assistance needed to correctly position infant at breast and maintain latch.  LATCH Score: 8  Interventions Interventions: Support pillows;Adjust position  Lactation Tools Discussed/Used     Consult Status Consult Status: Complete Follow-up type: Call as needed    Huston Foley 08/21/2019, 2:08 PM

## 2019-08-21 NOTE — Lactation Note (Signed)
This note was copied from a baby's chart. Lactation Consultation Note Attempted to see mom but she was sleeping.  Patient Name: Martha Gutierrez IZTIW'P Date: 08/21/2019     Maternal Data    Feeding    LATCH Score                   Interventions    Lactation Tools Discussed/Used     Consult Status      Charyl Dancer 08/21/2019, 3:56 AM

## 2020-09-27 LAB — TSH: TSH: 0.01 — AB (ref 0.41–5.90)

## 2020-10-06 NOTE — Progress Notes (Signed)
Cardiology Office Note:    Date:  10/07/2020   ID:  Martha Gutierrez, DOB May 26, 1990, MRN [id number]  PCP:  [facility] Providers Cardiologist:  None     Referring MD: Shirlean Mylar, MD   Chief Complaint  Patient presents with   New Patient (Initial Visit)   Chest Pain    A few weeks ago.  CC: Palpitations  History of Present Illness:    Martha Gutierrez is a 30 y.o. female with a hx of recently diagnosed hyperthyroidism who presents today for initial evaluation and management of palpitations.  Today, she reports that 3 weeks ago she developed chest pain and an elevated heart rate. Her chest pain occurred only twice, in her central left-sided chest. The first episode lasted for 2 days, and the second episode had a duration of 1 day. She describes it as a sharp pain, especially upon deep inspiration. However, she would still feel a dull ache in her chest when breathing normally. Her chest pain would worsen with more strenuous activity. At this visit she is not experiencing chest pain. After doing any normal activity or bending over, she would also notice her heart racing along with palpitations. The palpitations are still occurring. Lately, she has not been participating in formal exercise due to her recent symptoms. When she last visited her PCP, she was told she had a short PR interval on her EKG. Also she was told her hyperthyroidism may be postpartum, and she has not been given any medications as of today. Next week she is scheduled to see her endocrinologist.  In 08/2020 she delivered her baby. She has never been a smoker. In her family, her maternal grandfather had a heart attack in his 63's and then later died of another heart attack. She denies any shortness of breath. No headaches, lightheadedness, or syncope to report. Also has no lower extremity edema, orthopnea or PND.   Past Medical History:  Diagnosis Date   Gestational  diabetes    H/O gestational diabetes in prior pregnancy, currently pregnant     Past Surgical History:  Procedure Laterality Date   APPENDECTOMY     LAPAROSCOPIC APPENDECTOMY  08/18/2011   Procedure: APPENDECTOMY LAPAROSCOPIC;  Surgeon: Emelia Loron, MD;  Location: [facility];  Service: General;  Laterality: N/A;   None     WISDOM TOOTH EXTRACTION      Current Medications: Current Meds  Medication Sig   ibuprofen (ADVIL,MOTRIN) 600 MG tablet Take 1 tablet (600 mg total) by mouth every 6 (six) hours.   [DISCONTINUED] acetaminophen (TYLENOL) 325 MG tablet Take 2 tablets (650 mg total) by mouth every 4 (four) hours as needed (for pain scale < 4).   [DISCONTINUED] Prenatal Vit-Fe Fumarate-FA (PRENATAL MULTIVITAMIN) TABS tablet Take 1 tablet by mouth daily at 12 noon.     Allergies:   Patient has no known allergies.   Social History   Socioeconomic History   Marital status: Married    Spouse name: Not on file   Number of children: Not on file   Years of education: Not on file   Highest education level: Not on file  Occupational History   Not on file  Tobacco Use   Smoking status: Never   Smokeless tobacco: Never  Vaping Use   Vaping Use: Never used  Substance and Sexual Activity   Alcohol use: Not Currently    Comment: socially   Drug use: No   Sexual activity:  Yes  Other Topics Concern   Not on file  Social History Narrative   Not on file   Social Determinants of Health   Financial Resource Strain: Not on file  Food Insecurity: Not on file  Transportation Needs: Not on file  Physical Activity: Not on file  Stress: Not on file  Social Connections: Not on file     Family History: The patient's family history includes Cancer in her mother.  ROS:   Please see the history of present illness.    (+) Left-sided Central chest pain, sharp pain on deep inspiration, dull ache otherwise (+) Palpitations All other systems reviewed and are negative.  EKGs/Labs/Other  Studies Reviewed:    The following studies were reviewed today: No prior CV studies available.  EKG:   10/06/2020: Sinus tachycardia rhythm. Rate 111 bpm. No ST abnormalities.   Recent Labs: No results found for requested labs within last 8760 hours.  Recent Lipid Panel No results found for: CHOL, TRIG, HDL, CHOLHDL, VLDL, LDLCALC, LDLDIRECT    Physical Exam:    VS:  BP (!) 110/54 (BP Location: Left Arm, Patient Position: Sitting, Cuff Size: Normal)   Pulse (!) 111   Ht 5\' 4"  (1.626 m)   Wt 116 lb (52.6 kg)   BMI 19.91 kg/m     Wt Readings from Last 3 Encounters:  10/07/20 116 lb (52.6 kg)  08/19/19 154 lb 5.2 oz (70 kg)  03/30/19 130 lb 6.4 oz (59.1 kg)     GEN: Well nourished, well developed in no acute distress HEENT: Normal NECK: No JVD; No carotid bruits LYMPHATICS: No lymphadenopathy CARDIAC: tachycardic, regular, no murmurs, rubs, gallops RESPIRATORY:  Clear to auscultation without rales, wheezing or rhonchi  ABDOMEN: Soft, non-tender, non-distended MUSCULOSKELETAL:  No edema; No deformity  SKIN: Warm and dry NEUROLOGIC:  Alert and oriented x 3 PSYCHIATRIC:  Normal affect   ASSESSMENT:    1. Palpitations   2. Chest pain of uncertain etiology   3. Hyperthyroidism    PLAN:     Palpitations/chest pain: Suspect symptoms due to recently diagnosed hyperthyroidism, TSH less than 0.01 on 09/26/2020.  She has appointment with endocrinology next week.  Will check echocardiogram to rule out cardiac involvement.  Will check Zio patch x7 days to evaluate for arrhythmia  RTC in 4 months  Medication Adjustments/Labs and Tests Ordered: Current medicines are reviewed at length with the patient today.  Concerns regarding medicines are outlined above.  Orders Placed This Encounter  Procedures   LONG TERM MONITOR (3-14 DAYS)   EKG 12-Lead   ECHOCARDIOGRAM COMPLETE   No orders of the defined types were placed in this encounter.   Patient Instructions  Medication  Instructions:  Your physician recommends that you continue on your current medications as directed. Please refer to the Current Medication list given to you today.  *If you need a refill on your cardiac medications before your next appointment, please call your pharmacy*  Testing/Procedures: Your physician has requested that you have an echocardiogram. Echocardiography is a painless test that uses sound waves to create images of your heart. It provides your doctor with information about the size and shape of your heart and how well your heart's chambers and valves are working. This procedure takes approximately one hour. There are no restrictions for this procedure. This will be done at our [facility] location:  [address]  ZIO XT- Long Term Monitor Instructions   Your physician has requested you wear  a ZIO patch monitor for _7__ days.  This is a single patch monitor.   [employer] supplies one patch monitor per enrollment. Additional stickers are not available. Please do not apply patch if you will be having a Nuclear Stress Test, Echocardiogram, Cardiac CT, MRI, or Chest Xray during the period you would be wearing the monitor. The patch cannot be worn during these tests. You cannot remove and re-apply the ZIO XT patch monitor.  Your ZIO patch monitor will be sent Fed Ex from [employer] directly to your home address. It may take 3-5 days to receive your monitor after you have been enrolled.  Once you have received your monitor, please review the enclosed instructions. Your monitor has already been registered assigning a specific monitor serial # to you.  Billing and Patient Assistance Program Information   We have supplied [employer] with any of your insurance information on file for billing purposes. [employer] offers a sliding scale Patient Assistance Program for patients that do not have insurance, or whose insurance does not completely cover the cost of the ZIO monitor.    You must apply for the Patient Assistance Program to qualify for this discounted rate.     To apply, please call [employer] at [phone], select option 4, then select option 2, and ask to apply for Patient Assistance Program.  Meredeth Ide will ask your household income, and how many people are in your household.  They will quote your out-of-pocket cost based on that information.  [employer] will also be able to set up a 8-month, interest-free payment plan if needed.  Applying the monitor   Shave hair from upper left chest.  Hold abrader disc by orange tab. Rub abrader in 40 strokes over the upper left chest as indicated in your monitor instructions.  Clean area with 4 enclosed alcohol pads. Let dry.  Apply patch as indicated in monitor instructions. Patch will be placed under collarbone on left side of chest with arrow pointing upward.  Rub patch adhesive wings for 2 minutes. Remove white label marked "1". Remove the white label marked "2". Rub patch adhesive wings for 2 additional minutes.  While looking in a mirror, press and release button in center of patch. A small green light will flash 3-4 times. This will be your only indicator that the monitor has been turned on. ?  Do not shower for the first 24 hours. You may shower after the first 24 hours.  Press the button if you feel a symptom. You will hear a small click. Record Date, Time and Symptom in the Patient Logbook.  When you are ready to remove the patch, follow instructions on the last 2 pages of the Patient Logbook. Stick patch monitor onto the last page of Patient Logbook.  Place Patient Logbook in the blue and white box.  Use locking tab on box and tape box closed securely.  The blue and white box has prepaid postage on it. Please place it in the mailbox as soon as possible. Your physician should have your test results approximately 7 days after the monitor has been mailed back to [facility].  Call [facility] Customer Care at  [phone] if you have questions regarding your ZIO XT patch monitor. Call them immediately if you see an orange light blinking on your monitor.  If your monitor falls off in less than 4 days, contact our Monitor department at [phone]. ?If your monitor becomes loose or falls off after 4 days call [employer] at [phone] for suggestions  on securing your monitor.?  Follow-Up: At [facility], you and your health needs are our priority.  As part of our continuing mission to provide you with exceptional heart care, we have created designated Provider Care Teams.  These Care Teams include your primary Cardiologist (physician) and Advanced Practice Providers (APPs -  Physician Assistants and Nurse Practitioners) who all work together to provide you with the care you need, when you need it.  We recommend signing up for the patient portal called "MyChart".  Sign up information is provided on this After Visit Summary.  MyChart is used to connect with patients for Virtual Visits (Telemedicine).  Patients are able to view lab/test results, encounter notes, upcoming appointments, etc.  Non-urgent messages can be sent to your provider as well.   To learn more about what you can do with MyChart, go to [email or web address].    Your next appointment:   4 month(s)  The format for your next appointment:   In Person  Provider:   Epifanio Lescheshristopher Kohlton Gilpatrick, MD    [facility],Mathew Stumpf,acting as a scribe for Little Ishikawahristopher L Dashaun Onstott, MD.,have documented all relevant documentation on the behalf of Little Ish[place]awahristopher L Chief Walkup, MD,as directed by  Little Ish[place]ahristopher L Nichoel Digiulio, MD while in the presence of Little Ish[place]ahristopher L Hawa Henly, MD.  I, Little Ishikawahristopher L Earmon Sherrow, MD, have reviewed all documentation for this visit. The documentation on 10/07/20 for the exam, diagnosis, procedures, and orders are all accurate and complete.   Signed, Little Ishikawahristopher L Kaylor Simenson, MD  10/07/2020 8:34 AM    [facility]

## 2020-10-07 ENCOUNTER — Ambulatory Visit (INDEPENDENT_AMBULATORY_CARE_PROVIDER_SITE_OTHER): Admitting: Cardiology

## 2020-10-07 ENCOUNTER — Ambulatory Visit

## 2020-10-07 ENCOUNTER — Encounter: Payer: Self-pay | Admitting: Cardiology

## 2020-10-07 ENCOUNTER — Other Ambulatory Visit: Payer: Self-pay

## 2020-10-07 VITALS — BP 110/54 | HR 111 | Ht 64.0 in | Wt 116.0 lb

## 2020-10-07 DIAGNOSIS — R079 Chest pain, unspecified: Secondary | ICD-10-CM | POA: Diagnosis not present

## 2020-10-07 DIAGNOSIS — E0590 Thyrotoxicosis, unspecified without thyrotoxic crisis or storm: Secondary | ICD-10-CM

## 2020-10-07 DIAGNOSIS — R002 Palpitations: Secondary | ICD-10-CM | POA: Diagnosis not present

## 2020-10-07 NOTE — Progress Notes (Unsigned)
Enrolled patient for a 7 day Zio XT Monitor to be mailed to patients home.  

## 2020-10-07 NOTE — Patient Instructions (Signed)
Medication Instructions:  Your physician recommends that you continue on your current medications as directed. Please refer to the Current Medication list given to you today.  *If you need a refill on your cardiac medications before your next appointment, please call your pharmacy*  Testing/Procedures: Your physician has requested that you have an echocardiogram. Echocardiography is a painless test that uses sound waves to create images of your heart. It provides your doctor with information about the size and shape of your heart and how well your heart's chambers and valves are working. This procedure takes approximately one hour. There are no restrictions for this procedure. This will be done at our [facility] location:  [address]  ZIO XT- Long Term Monitor Instructions   Your physician has requested you wear a ZIO patch monitor for _7__ days.  This is a single patch monitor.   [employer] supplies one patch monitor per enrollment. Additional stickers are not available. Please do not apply patch if you will be having a Nuclear Stress Test, Echocardiogram, Cardiac CT, MRI, or Chest Xray during the period you would be wearing the monitor. The patch cannot be worn during these tests. You cannot remove and re-apply the ZIO XT patch monitor.  Your ZIO patch monitor will be sent Fed Ex from [employer] directly to your home address. It may take 3-5 days to receive your monitor after you have been enrolled.  Once you have received your monitor, please review the enclosed instructions. Your monitor has already been registered assigning a specific monitor serial # to you.  Billing and Patient Assistance Program Information   We have supplied [employer] with any of your insurance information on file for billing purposes. [employer] offers a sliding scale Patient Assistance Program for patients that do not have insurance, or whose insurance does not completely cover the cost of the  ZIO monitor.   You must apply for the Patient Assistance Program to qualify for this discounted rate.     To apply, please call [employer] at [phone], select option 4, then select option 2, and ask to apply for Patient Assistance Program.  Meredeth Ide will ask your household income, and how many people are in your household.  They will quote your out-of-pocket cost based on that information.  [employer] will also be able to set up a 50-month, interest-free payment plan if needed.  Applying the monitor   Shave hair from upper left chest.  Hold abrader disc by orange tab. Rub abrader in 40 strokes over the upper left chest as indicated in your monitor instructions.  Clean area with 4 enclosed alcohol pads. Let dry.  Apply patch as indicated in monitor instructions. Patch will be placed under collarbone on left side of chest with arrow pointing upward.  Rub patch adhesive wings for 2 minutes. Remove white label marked "1". Remove the white label marked "2". Rub patch adhesive wings for 2 additional minutes.  While looking in a mirror, press and release button in center of patch. A small green light will flash 3-4 times. This will be your only indicator that the monitor has been turned on. ?  Do not shower for the first 24 hours. You may shower after the first 24 hours.  Press the button if you feel a symptom. You will hear a small click. Record Date, Time and Symptom in the Patient Logbook.  When you are ready to remove the patch, follow instructions on the last 2 pages of the Patient  Logbook. Stick patch monitor onto the last page of Patient Logbook.  Place Patient Logbook in the blue and white box.  Use locking tab on box and tape box closed securely.  The blue and white box has prepaid postage on it. Please place it in the mailbox as soon as possible. Your physician should have your test results approximately 7 days after the monitor has been mailed back to [facility].  Call [facility] Customer Care  at [phone] if you have questions regarding your ZIO XT patch monitor. Call them immediately if you see an orange light blinking on your monitor.  If your monitor falls off in less than 4 days, contact our Monitor department at [phone]. ?If your monitor becomes loose or falls off after 4 days call [employer] at [phone] for suggestions on securing your monitor.?  Follow-Up: At [facility], you and your health needs are our priority.  As part of our continuing mission to provide you with exceptional heart care, we have created designated Provider Care Teams.  These Care Teams include your primary Cardiologist (physician) and Advanced Practice Providers (APPs -  Physician Assistants and Nurse Practitioners) who all work together to provide you with the care you need, when you need it.  We recommend signing up for the patient portal called "MyChart".  Sign up information is provided on this After Visit Summary.  MyChart is used to connect with patients for Virtual Visits (Telemedicine).  Patients are able to view lab/test results, encounter notes, upcoming appointments, etc.  Non-urgent messages can be sent to your provider as well.   To learn more about what you can do with MyChart, go to [email or web address].    Your next appointment:   4 month(s)  The format for your next appointment:   In Person  Provider:   Epifanio Lesches, MD

## 2020-10-12 ENCOUNTER — Ambulatory Visit (INDEPENDENT_AMBULATORY_CARE_PROVIDER_SITE_OTHER): Admitting: Nurse Practitioner

## 2020-10-12 ENCOUNTER — Other Ambulatory Visit: Payer: Self-pay

## 2020-10-12 ENCOUNTER — Encounter: Payer: Self-pay | Admitting: Nurse Practitioner

## 2020-10-12 VITALS — BP 106/69 | HR 102 | Ht 64.0 in | Wt 117.0 lb

## 2020-10-12 DIAGNOSIS — E0590 Thyrotoxicosis, unspecified without thyrotoxic crisis or storm: Secondary | ICD-10-CM | POA: Diagnosis not present

## 2020-10-12 NOTE — Progress Notes (Signed)
   10/12/2020     Endocrinology Consult Note    Subjective:    Patient ID: Martha Gutierrez, female    DOB: 05/25/1990, PCP Webb, Carol, MD.   Past Medical History:  Diagnosis Date   Gestational diabetes    H/O gestational diabetes in prior pregnancy, currently pregnant     Past Surgical History:  Procedure Laterality Date   APPENDECTOMY     LAPAROSCOPIC APPENDECTOMY  08/18/2011   Procedure: APPENDECTOMY LAPAROSCOPIC;  Surgeon: Matthew Wakefield, MD;  Location: [facility];  Service: General;  Laterality: N/A;   None     WISDOM TOOTH EXTRACTION      Social History   Socioeconomic History   Marital status: Married    Spouse name: Not on file   Number of children: Not on file   Years of education: Not on file   Highest education level: Not on file  Occupational History   Not on file  Tobacco Use   Smoking status: Never   Smokeless tobacco: Never  Vaping Use   Vaping Use: Never used  Substance and Sexual Activity   Alcohol use: Not Currently    Comment: socially   Drug use: No   Sexual activity: Yes  Other Topics Concern   Not on file  Social History Narrative   Not on file   Social Determinants of Health   Financial Resource Strain: Not on file  Food Insecurity: Not on file  Transportation Needs: Not on file  Physical Activity: Not on file  Stress: Not on file  Social Connections: Not on file    Family History  Problem Relation Age of Onset   Cancer Mother        lymphoma    Outpatient Encounter Medications as of 10/12/2020  Medication Sig   ibuprofen (ADVIL,MOTRIN) 600 MG tablet Take 1 tablet (600 mg total) by mouth every 6 (six) hours.   No facility-administered encounter medications on file as of 10/12/2020.    ALLERGIES: No Known Allergies  VACCINATION STATUS: Immunization History  Administered Date(s) Administered   MMR 03/02/2016     HPI  Martha Gutierrez is 29 y.o. female who presents today with a medical history as above.  she is being seen in consultation for hyperthyroidism requested by Webb, Carol, MD.  she has been dealing with symptoms of chest pain and unexplained weight loss for approximately 2 weeks. These symptoms are progressively worsening and troubling to her.  her most recent thyroid labs revealed suppressed TSH of < 0.01, FT4 of 2.69, FT3 of 8.73 and Thyrotropin Receptor Antibodies of 6.82 on 09/27/20.  -She is approximately 1 year postpartum, just recently stopped breast feeding.  She was prescribed Propanolol to use as needed for heart palpitations but she has not taken any.  she denies dysphagia, choking, shortness of breath, no recent voice change.    she denies family history of thyroid dysfunction and denies family hx of thyroid cancer. she denies personal history of goiter. she is not on any anti-thyroid medications nor on any thyroid hormone supplements. Denies use of Biotin containing supplements.  she is willing to proceed with appropriate work up and therapy for thyrotoxicosis.   Review of systems  Constitutional: + steadily decreasing body weight-unintentional, current Body mass index is 20.08 kg/m., no fatigue, no subjective hyperthermia, no subjective hypothermia Eyes: no blurry vision, no xerophthalmia ENT: no sore throat, no nodules palpated in throat, no dysphagia/odynophagia, no hoarseness Cardiovascular: + intermittent chest pain (2 weeks ago), no   shortness of breath, no palpitations, no leg swelling Respiratory: no cough, no shortness of breath Gastrointestinal: no nausea/vomiting/diarrhea Musculoskeletal: no muscle/joint aches Skin: no rashes, no hyperemia Neurological: + tremors, no numbness, no tingling, no dizziness Psychiatric: no depression, no anxiety   Objective:    BP 106/69   Pulse (!) 102   Ht 5' 4" (1.626 m)   Wt 117 lb (53.1 kg)   BMI 20.08 kg/m   Wt Readings from Last 3 Encounters:  10/12/20 117 lb (53.1 kg)  10/07/20 116 lb (52.6 kg)  08/19/19 154 lb  5.2 oz (70 kg)     BP Readings from Last 3 Encounters:  10/12/20 106/69  10/07/20 (!) 110/54  08/21/19 100/77                          Physical Exam- Limited  Constitutional:  Body mass index is 20.08 kg/m. , not in acute distress, normal state of mind Eyes:  EOMI, no exophthalmos Neck: Supple Thyroid: Mild goiter Cardiovascular: Tachycardic, no murmurs, rubs, or gallops, no edema Respiratory: Adequate breathing efforts, no crackles, rales, rhonchi, or wheezing Musculoskeletal: no gross deformities, strength intact in all four extremities, no gross restriction of joint movements Skin:  no rashes, no hyperemia Neurological: ++ tremor with outstretched hands, Deep tendon reflexes WNL   CMP     Component Value Date/Time   NA 135 08/18/2011 1343   K 3.4 (L) 08/18/2011 1343   CL 100 08/18/2011 1343   CO2 25 08/18/2011 1343   GLUCOSE 84 08/18/2011 1343   BUN 9 08/18/2011 1343   CREATININE 0.65 08/18/2011 1343   CALCIUM 8.9 08/18/2011 1343   PROT 7.1 08/18/2011 1343   ALBUMIN 3.5 08/18/2011 1343   AST 13 08/18/2011 1343   ALT 10 08/18/2011 1343   ALKPHOS 52 08/18/2011 1343   BILITOT 0.3 08/18/2011 1343   GFRNONAA >90 08/18/2011 1343   GFRAA >90 08/18/2011 1343     CBC    Component Value Date/Time   WBC 14.8 (H) 08/20/2019 0628   RBC 3.91 08/20/2019 0628   HGB 11.7 (L) 08/20/2019 0628   HCT 34.9 (L) 08/20/2019 0628   PLT 238 08/20/2019 0628   MCV 89.3 08/20/2019 0628   MCH 29.9 08/20/2019 0628   MCHC 33.5 08/20/2019 0628   RDW 13.8 08/20/2019 0628   LYMPHSABS 2.4 08/18/2011 1343   MONOABS 1.0 08/18/2011 1343   EOSABS 0.1 08/18/2011 1343   BASOSABS 0.0 08/18/2011 1343     Diabetic Labs (most recent): No results found for: HGBA1C  Lipid Panel  No results found for: CHOL, TRIG, HDL, CHOLHDL, VLDL, LDLCALC, LDLDIRECT, LABVLDL   Lab Results  Component Value Date   TSH 0.01 (A) 09/27/2020        Assessment & Plan:   1. Hyperthyroidism- suspect from  [place]  she is being seen at a kind request of Maurice Small, MD. her history and most recent labs are reviewed, and she was examined clinically. Subjective and objective findings are consistent with thyrotoxicosis likely from primary hyperthyroidism. The potential risks of untreated thyrotoxicosis and the need for definitive therapy have been discussed in detail with her, and she agrees to proceed with diagnostic workup and treatment plan.   Confirmatory thyroid uptake and scan will be scheduled to be done as soon as possible since she has stopped breastfeeding.   Options of therapy are discussed with her.  We discussed the option of treating it with medications including  methimazole or PTU which may have side effects including rash, transaminitis, and bone marrow suppression.  We also discussed the option of definitive therapy with RAI ablation of the thyroid. If she is found to have primary hyperthyroidism from Graves' disease , toxic multinodular goiter or toxic nodular goiter the preferred modality of treatment would be I-131 thyroid ablation. Surgery is another choice of treatment in some cases, in her case surgery is not a good fit for presentation with only mild goiter.  -Patient is made aware of the high likelihood of post ablative hypothyroidism with subsequent need for lifelong thyroid hormone replacement. sheunderstands this outcome and she is  willing to proceed.      she will return in 2 weeks for treatment decision.      -Patient is advised to maintain close follow up with Webb, Carol, MD for primary care needs.   - Time spent with the patient: 60 minutes, of which >50% was spent in obtaining information about her symptoms, reviewing her previous labs, evaluations, and treatments, counseling her about her hyperthyroidism , and developing a plan to confirm the diagnosis and long term treatment as necessary. Please refer to "Patient Self Inventory" in the Media tab for  reviewed elements of pertinent patient history.  Martha Gutierrez participated in the discussions, expressed understanding, and voiced agreement with the above plans.  All questions were answered to her satisfaction. she is encouraged to contact clinic should she have any questions or concerns prior to her return visit.   Follow up plan: Return in about 2 weeks (around 10/26/2020) for Thyroid follow up; uptake and scan.   Thank you for involving me in the care of this pleasant patient, and I will continue to update you with her progress.    Whitney Reardon, FNP-BC [facility] [address] [place], [place] [address] Phone: [phone] Fax: [phone]  10/12/2020, 2:55 PM   

## 2020-10-12 NOTE — Patient Instructions (Signed)
Hyperthyroidism  Hyperthyroidism is when the thyroid gland is too active (overactive). The thyroid gland is a small gland located in the lower front part of the neck, just in front of the windpipe (trachea). This gland makes hormones that help control how the body uses food for energy (metabolism) as well as how the heart and brain function. These hormones also play a role in keeping your bones strong. When the thyroid is overactive, it produces toomuch of a hormone called thyroxine. What are the causes? This condition may be caused by: Graves' disease. This is a disorder in which the body's disease-fighting system (immune system) attacks the thyroid gland. This is the most common cause. Inflammation of the thyroid gland. A tumor in the thyroid gland. Use of certain medicines, including: Prescription thyroid hormone replacement. Herbal supplements that mimic thyroid hormones. Amiodarone therapy. Solid or fluid-filled lumps within your thyroid gland (thyroid nodules). Taking in a large amount of iodine from foods or medicines. What increases the risk? You are more likely to develop this condition if: You are female. You have a family history of thyroid conditions. You smoke tobacco. You use a medicine called lithium. You take medicines that affect the immune system (immunosuppressants). What are the signs or symptoms? Symptoms of this condition include: Nervousness. Inability to tolerate heat. Unexplained weight loss. Diarrhea. Change in the texture of hair or skin. Heart skipping beats or making extra beats. Rapid heart rate. Loss of menstruation. Shaky hands. Fatigue. Restlessness. Sleep problems. Enlarged thyroid gland or a lump in the thyroid (nodule). You may also have symptoms of Graves' disease, which may include: Protruding eyes. Dry eyes. Red or swollen eyes. Problems with vision. How is this diagnosed? This condition may be diagnosed based on: Your symptoms and  medical history. A physical exam. Blood tests. Thyroid ultrasound. This test involves using sound waves to produce images of the thyroid gland. A thyroid scan. A radioactive substance is injected into a vein, and images show how much iodine is present in the thyroid. Radioactive iodine uptake test (RAIU). A small amount of radioactive iodine is given by mouth to see how much iodine the thyroid absorbs after a certain amount of time. How is this treated? Treatment depends on the cause and severity of the condition. Treatment may include: Medicines to reduce the amount of thyroid hormone your body makes. Radioactive iodine treatment (radioiodine therapy). This involves swallowing a small dose of radioactive iodine, in capsule or liquid form, to kill thyroid cells. Surgery to remove part or all of your thyroid gland. You may need to take thyroid hormone replacement medicine for the rest of your life after thyroid surgery. Medicines to help manage your symptoms. Follow these instructions at home:  Take over-the-counter and prescription medicines only as told by your health care provider. Do not use any products that contain nicotine or tobacco, such as cigarettes and e-cigarettes. If you need help quitting, ask your health care provider. Follow any instructions from your health care provider about diet. You may be instructed to limit foods that contain iodine. Keep all follow-up visits as told by your health care provider. This is important. You will need to have blood tests regularly so that your health care provider can monitor your condition. Contact a health care provider if: Your symptoms do not get better with treatment. You have a fever. You are taking thyroid hormone replacement medicine and you: Have symptoms of depression. Feel like you are tired all the time. Gain weight. Get help right   away if: You have chest pain. You have decreased alertness or a change in your awareness. You  have abdominal pain. You feel dizzy. You have a rapid heartbeat. You have an irregular heartbeat. You have difficulty breathing. Summary The thyroid gland is a small gland located in the lower front part of the neck, just in front of the windpipe (trachea). Hyperthyroidism is when the thyroid gland is too active (overactive) and produces too much of a hormone called thyroxine. The most common cause is Graves' disease, a disorder in which your immune system attacks the thyroid gland. Hyperthyroidism can cause various symptoms, such as unexplained weight loss, nervousness, inability to tolerate heat, or changes in your heartbeat. Treatment may include medicine to reduce the amount of thyroid hormone your body makes, radioiodine therapy, surgery, or medicines to manage symptoms. This information is not intended to replace advice given to you by your health care provider. Make sure you discuss any questions you have with your healthcare provider. Document Revised: 12/17/2019 Document Reviewed: 12/17/2019 Elsevier Patient Education  2022 [employer]  

## 2020-10-14 ENCOUNTER — Ambulatory Visit: Payer: Self-pay | Admitting: Nurse Practitioner

## 2020-10-24 ENCOUNTER — Other Ambulatory Visit: Payer: Self-pay

## 2020-10-24 ENCOUNTER — Encounter (HOSPITAL_COMMUNITY): Payer: Self-pay

## 2020-10-24 ENCOUNTER — Ambulatory Visit (HOSPITAL_COMMUNITY)

## 2020-10-24 ENCOUNTER — Encounter (HOSPITAL_COMMUNITY): Admission: RE | Admit: 2020-10-24 | Source: Ambulatory Visit

## 2020-10-25 ENCOUNTER — Ambulatory Visit (HOSPITAL_COMMUNITY)

## 2020-10-25 ENCOUNTER — Encounter (HOSPITAL_COMMUNITY): Payer: Self-pay

## 2020-10-27 ENCOUNTER — Ambulatory Visit (HOSPITAL_COMMUNITY): Attending: Cardiology

## 2020-10-27 ENCOUNTER — Other Ambulatory Visit: Payer: Self-pay

## 2020-10-27 DIAGNOSIS — R002 Palpitations: Secondary | ICD-10-CM | POA: Diagnosis not present

## 2020-10-27 DIAGNOSIS — R079 Chest pain, unspecified: Secondary | ICD-10-CM

## 2020-10-27 LAB — ECHOCARDIOGRAM COMPLETE
Area-P 1/2: 4.06 cm2
S' Lateral: 2.6 cm

## 2020-10-31 ENCOUNTER — Ambulatory Visit: Admitting: Nurse Practitioner

## 2020-11-07 ENCOUNTER — Other Ambulatory Visit (HOSPITAL_COMMUNITY)

## 2020-11-07 ENCOUNTER — Ambulatory Visit (HOSPITAL_COMMUNITY)

## 2020-11-08 ENCOUNTER — Other Ambulatory Visit (HOSPITAL_COMMUNITY)

## 2020-11-14 ENCOUNTER — Ambulatory Visit: Admitting: Nurse Practitioner

## 2020-12-17 IMAGING — US US MFM OB DETAIL+14 WK
1 series · 15 of 28 positions shown · non-contrast
Comparison: none

[Series 1: us mfm ob detail+14 wk · 130 acquisitions, 15 frames shown]
[im 1/130]
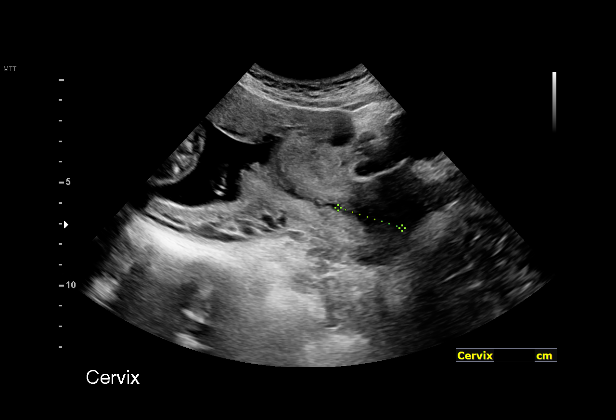
[im 10/130]
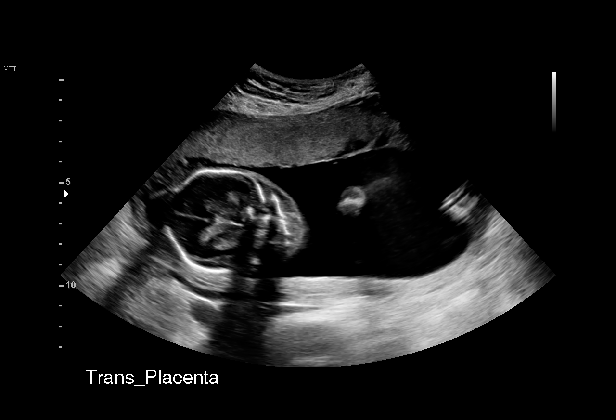
[im 20/130]
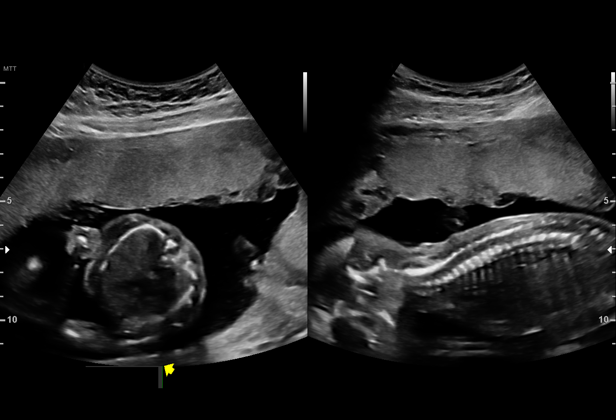
[im 29/130]
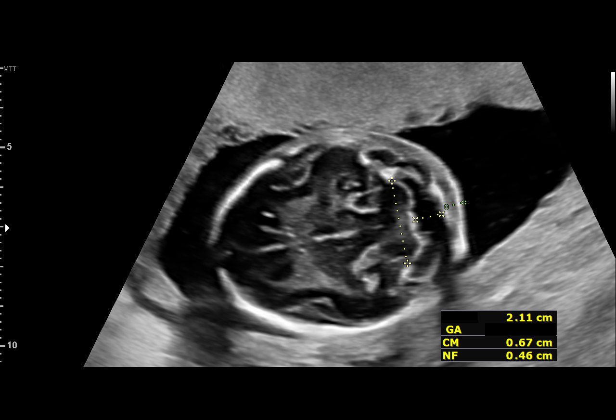
[im 39/130]
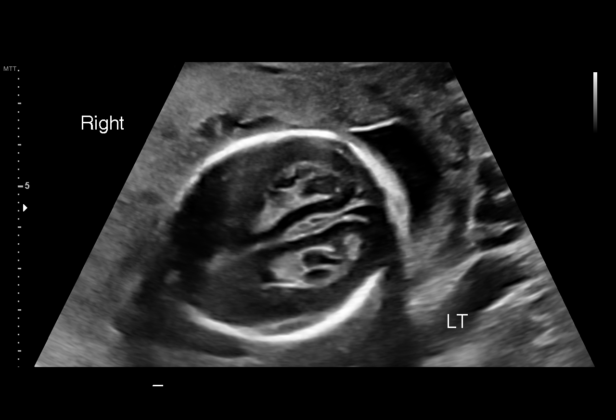
[im 48/130]
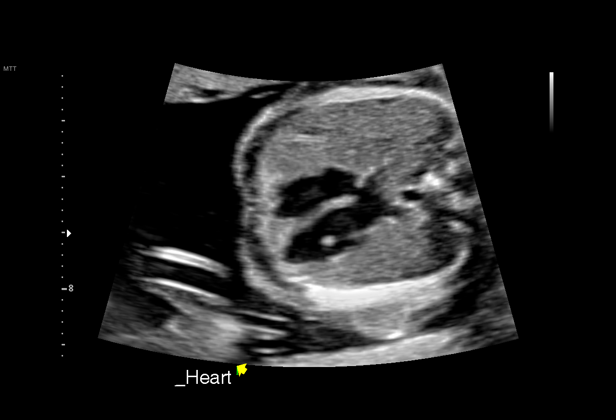
[im 58/130]
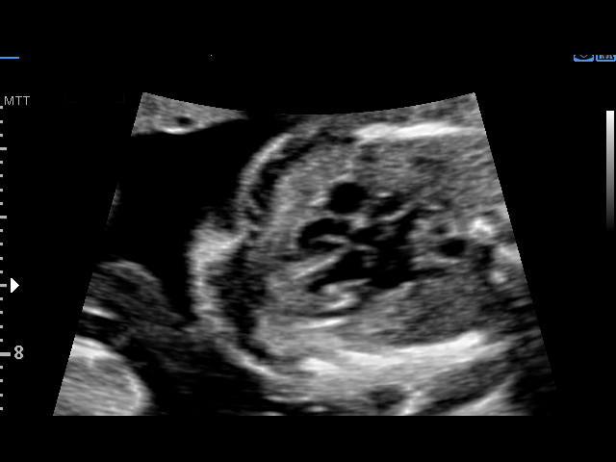
[im 67/130]
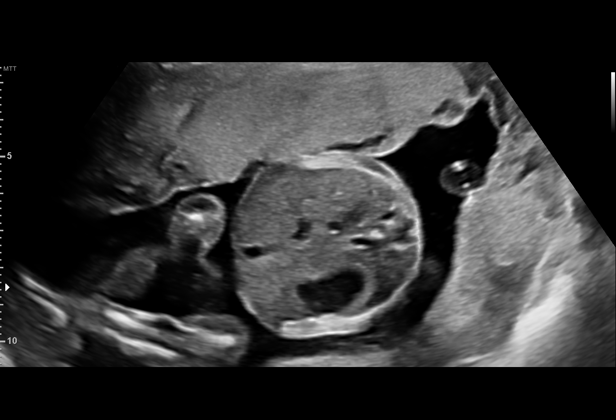
[im 72/130]
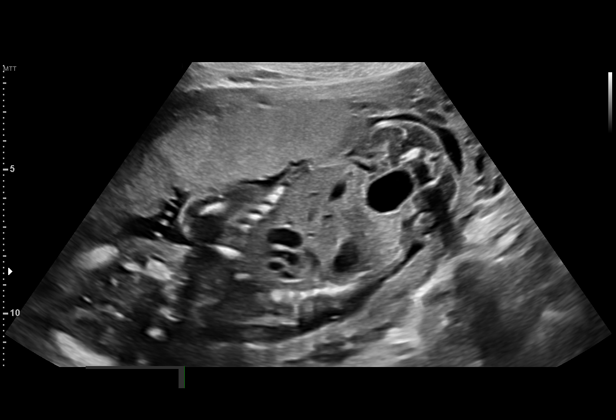
[im 82/130]
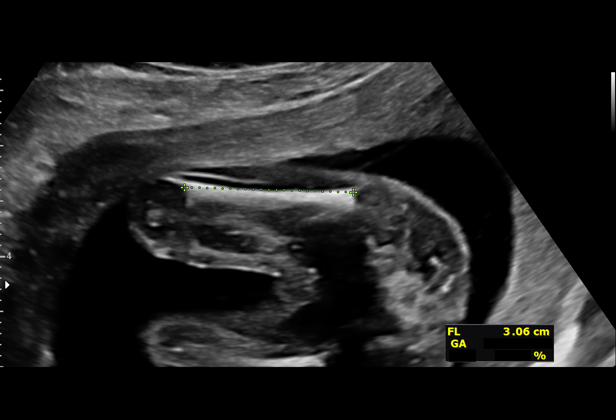
[im 91/130]
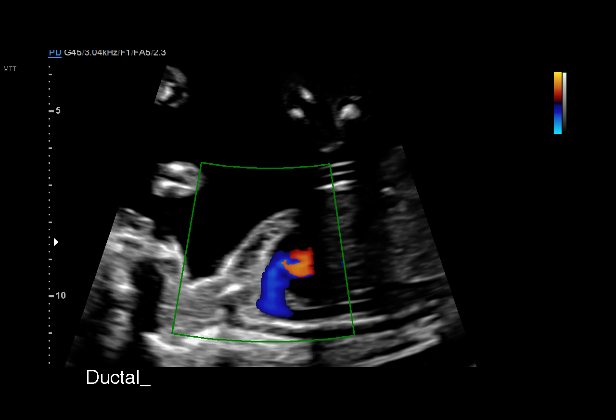
[im 101/130]
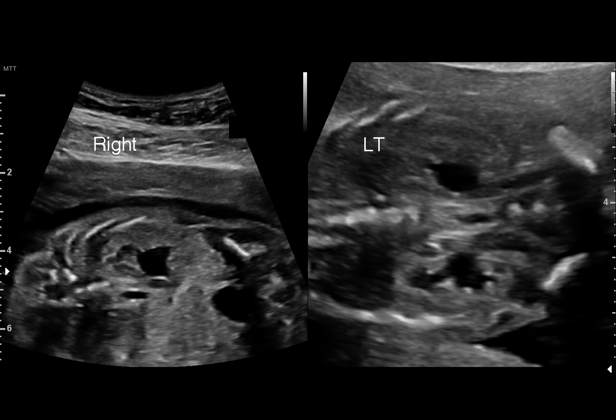
[im 110/130]
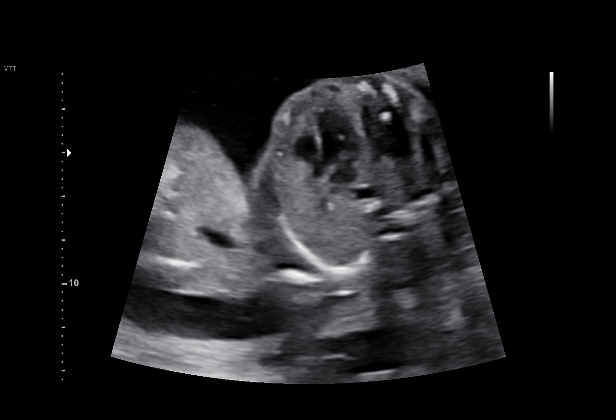
[im 120/130]
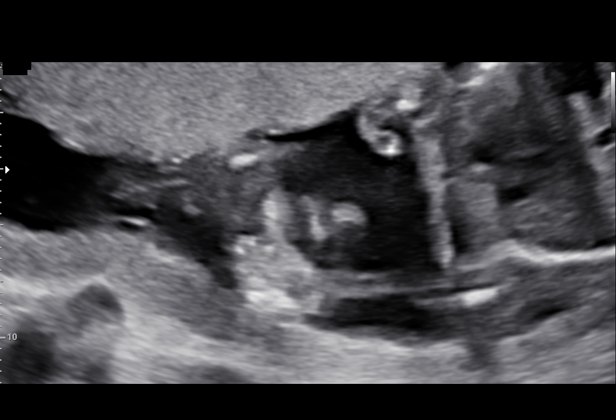
[im 130/130]
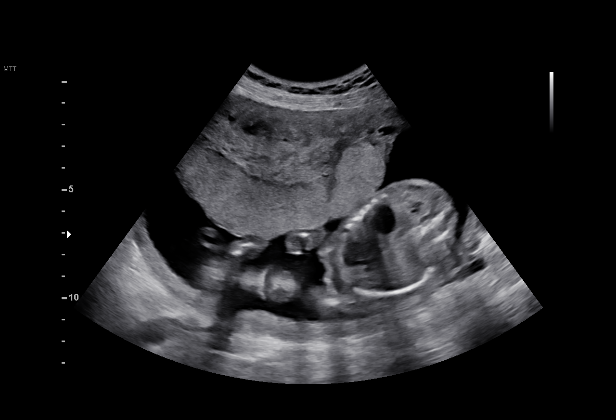

[15 of 28 positions shown; findings below may reference images not displayed]

----------------------------------------------------------------------

 ----------------------------------------------------------------------
Indications

  Abnormal ultrasound finding on antenatal
  screening of mother (EIF in LV, Bilateral
  Renal Pyelectasis, Bilateral CP Cysts)
  Encounter for antenatal screening for
  malformations
  Poor obstetric history: Previous gestational
  diabetes
  19 weeks gestation of pregnancy
 ----------------------------------------------------------------------
Fetal Evaluation

 Num Of Fetuses:         1
 Fetal Heart Rate(bpm):  148
 Cardiac Activity:       Observed
 Presentation:           Transverse, head to maternal right
 Placenta:               Anterior
 P. Cord Insertion:      Visualized, central

 Amniotic Fluid
 AFI FV:      Within normal limits

                             Largest Pocket(cm)

Biometry

 BPD:      49.3  mm     G. Age:  20w 6d         91  %    CI:        77.02   %    70 - 86
                                                         FL/HC:      17.4   %    16.8 -
 HC:      177.9  mm     G. Age:  20w 2d         68  %    HC/AC:      1.09        1.09 -
 AC:      162.6  mm     G. Age:  21w 2d         90  %    FL/BPD:     62.9   %
 FL:         31  mm     G. Age:  19w 4d         39  %    FL/AC:      19.1   %    20 - 24
 HUM:      30.5  mm     G. Age:  20w 1d         62  %
 CER:      21.1  mm     G. Age:  20w 1d         56  %
 NFT:       4.6  mm
 LV:       12.5  mm
 CM:        6.7  mm

 Est. FW:     360  gm    0 lb 13 oz      88  %
OB History

 Blood Type:    O+
 Gravidity:    2         Term:   1        Prem:   0        SAB:   0
 TOP:          0       Ectopic:  0        Living: 1
Gestational Age

 LMP:           19w 5d        Date:  11/12/18                 EDD:   08/19/19
 U/S Today:     20w 4d                                        EDD:   08/13/19
 Best:          19w 5d     Det. By:  LMP  (11/12/18)          EDD:   08/19/19
Anatomy

 Cranium:               Appears normal         LVOT:                   Appears normal
 Cavum:                 Appears normal         Aortic Arch:            Appears normal
 Ventricles:            Appears normal         Ductal Arch:            Appears normal
 Choroid Plexus:        Bilateral choroid      Diaphragm:              Appears normal
                        plexus cysts
 Cerebellum:            Appears normal         Stomach:                Appears normal, left
                                                                       sided
 Posterior Fossa:       Appears normal         Abdomen:                Appears normal
 Nuchal Fold:           Appears normal         Abdominal Wall:         Appears nml (cord
                                                                       insert, abd wall)
 Face:                  Appears normal         Cord Vessels:           Appears normal (3
                        (orbits and profile)                           vessel cord)
 Lips:                  Appears normal         Kidneys:                Bilat pyelectasis,
                                                                       Rt 6mm, Lt 5mm
 Palate:                Not well visualized    Bladder:                Appears normal
 Thoracic:              Appears normal         Spine:                  Appears normal
 Heart:                 Echogenic focus        Upper Extremities:      Appears normal
                        in LV
 RVOT:                  Appears normal         Lower Extremities:      Appears normal

 Other:  Male gender. Nasal bone visualized. Open hands visualized. Heels
         and 5th digit visualized. Technically difficult due to fetal position.
Cervix Uterus Adnexa

 Cervix
 Length:           3.26  cm.
 Normal appearance by transabdominal scan.

 Uterus
 No abnormality visualized.

 Left Ovary
 Within normal limits. No adnexal mass visualized.

 Right Ovary
 Within normal limits. No adnexal mass visualized.

 Cul De Sac
 No free fluid seen.
 Adnexa
 No abnormality visualized.
Comments

 This patient was seen for a detailed fetal anatomy scan as
 bilateral choroid plexus cysts, an echogenic focus, bilateral
 pyelectasis, and possible ventriculomegaly were noted on an
 ultrasound performed in your office.  The patient reports a
 history of gestational diabetes in her prior pregnancy.  She
 reports that she was screened early for gestational diabetes
 in her current pregnancy and she failed the test by a few
 points.  She is planning on performing daily fingersticks to
 monitor her blood glucose levels. She denies any other
 significant past medical history.
 She had a quad screen which indicated a Down syndrome
 risk of [DATE] and a trisomy 18 risk of [DATE].  An MSAFP
 was 1.28 MoM.  The patient reports that she had a cell free
 DNA test drawn last week once these ultrasound findings
 were noted.  The results of her cell free DNA test is currently
 pending.
 She was informed that the fetal growth and amniotic fluid
 level were appropriate for her gestational age.
 On today's exam, bilateral choroid plexus cysts were noted in
 the fetal brain.  The implications and management of choroid
 plexus cysts were discussed today.  She was advised that the
 choroid plexus cysts are most likely normal variants and will
 usually resolve at around 24 weeks.  The small association of
 bilateral choroid plexus cysts with trisomy 18 was discussed.
 She was advised that based on the results of her quad
 screen, it is unlikely that her baby will have trisomy 18.
 An intracardiac echogenic focus was noted in the left
 ventricle of the fetal heart.  The small association between an
 echogenic focus and Down syndrome was discussed.
 Mild bilateral pyelectasis was noted on today's ultrasound
 exam.  The patient was advised that the mild bilateral
 pylectasis noted today is most likely a normal variant.
 However, there may also be other processes causing this
 finding that may require treatment after birth. The association
 between pyelectasis and Down syndrome was discussed.
 There were no signs of ventriculomegaly noted today.  The
 lateral ventricles may appear to measure larger due to the
 large choroid plexus cysts.
 The patient was informed that anomalies may be missed due
 to technical limitations. If the fetus is in a suboptimal position
 or maternal habitus is increased, visualization of the fetus in
 the maternal uterus may be impaired.
 The patient was advised that despite the three minor markers
 associated with fetal aneuploidy that were noted on today's
 ultrasound, there is still a high likelihood that her baby is
 genetically normal based on the low risk for trisomy 18 and
 21 indicated on her quad screen.  She was offered and
 declined an amniocentesis today for definitive diagnosis of
 fetal aneuploidy.  She will await the results of her cell free
 DNA test.  The patient also met with our genetic counselor
 today to discuss the significance of these ultrasound findings.
 A follow-up exam was scheduled in our office in 5 weeks to
 reassess the bilateral choroid plexus cysts and the bilateral
 pyelectasis noted today.  She should be referred back to our
 office for consultation prior to 5 weeks should her cell free
 DNA test indicate an increased risk for fetal aneuploidy.
 A total of 30 minutes was spent counseling and coordinating
 the care for this patient.  Greater than 50% of the time was
 spent in direct face-to-face contact.

## 2021-01-21 IMAGING — US US MFM OB FOLLOW-UP
1 series · 13 of 28 positions shown · non-contrast
Comparison: none

[Series 1: us mfm ob follow-up · 13 of 48 slices shown]
[im 2/48]
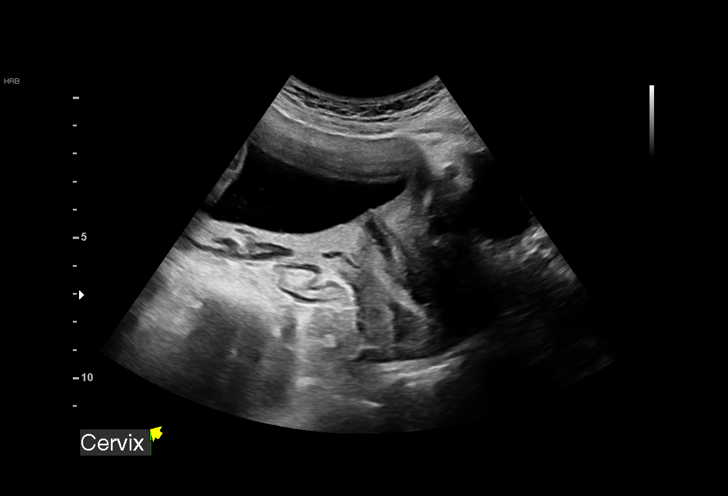
[im 6/48]
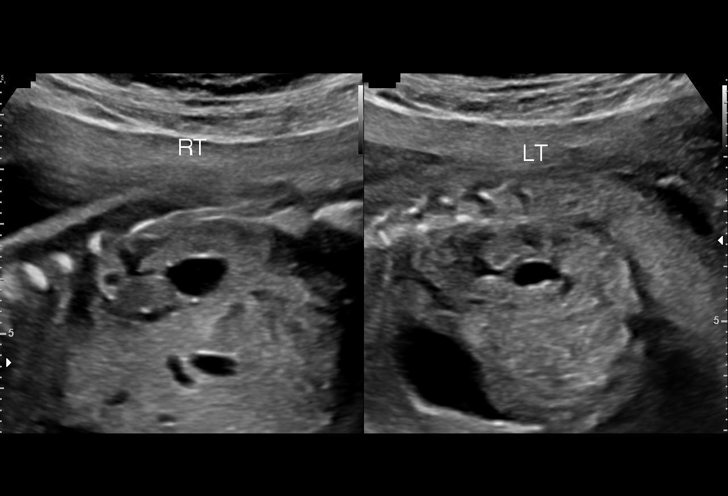
[im 9/48]
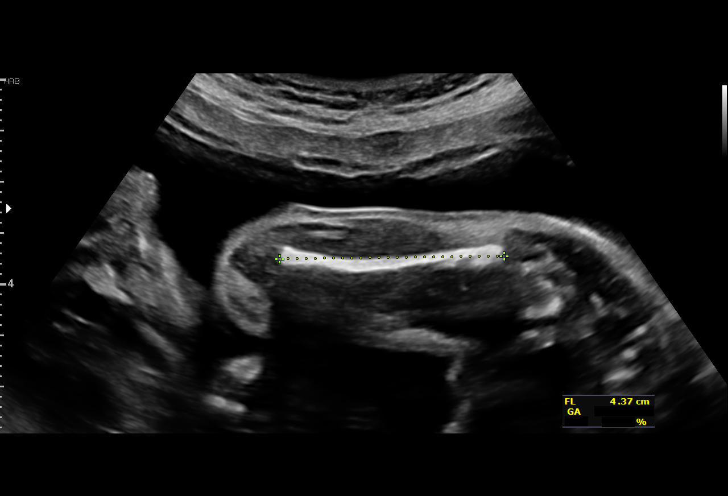
[im 13/48]
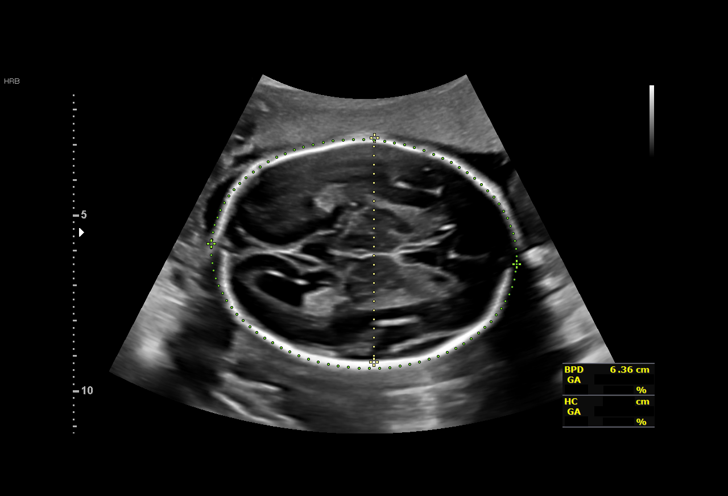
[im 16/48]
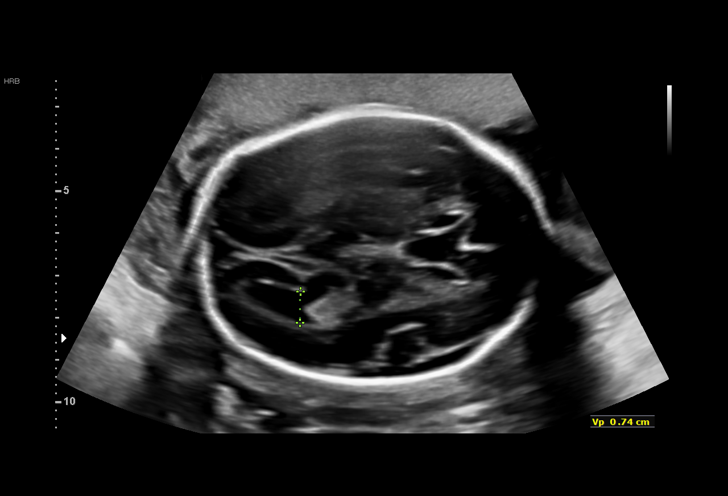
[im 20/48]
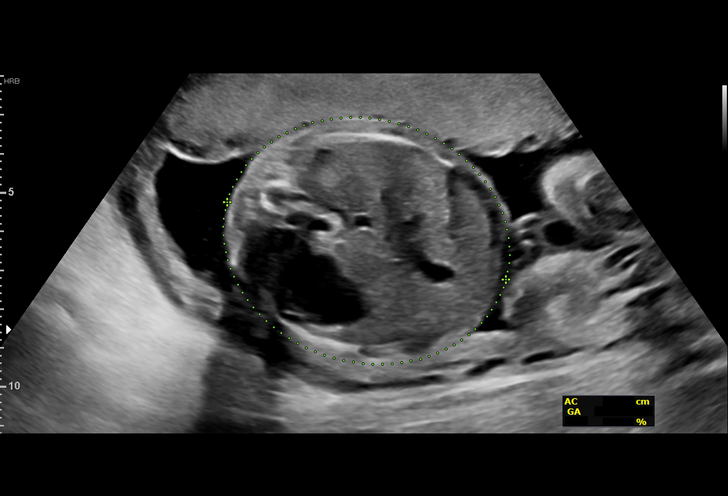
[im 25/48]
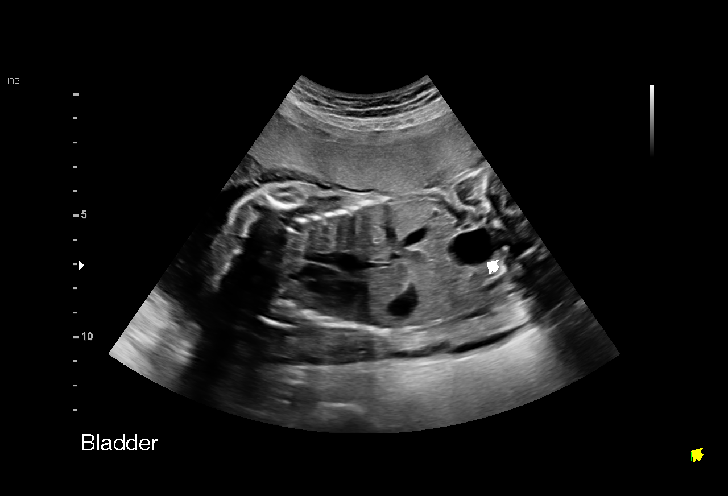
[im 28/48]
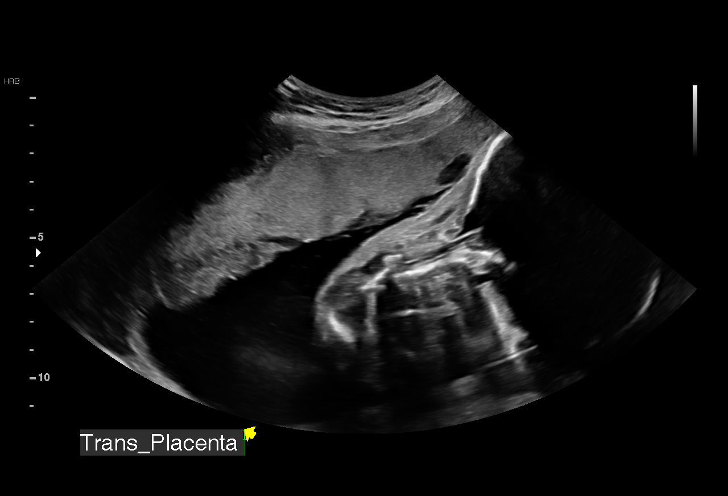
[im 32/48]
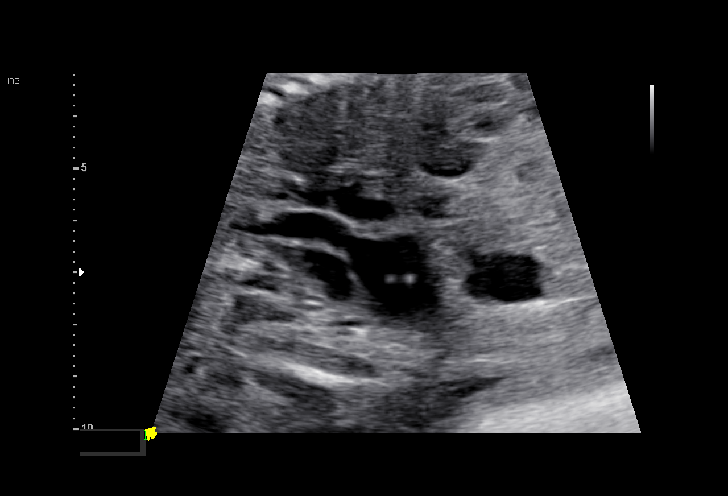
[im 35/48]
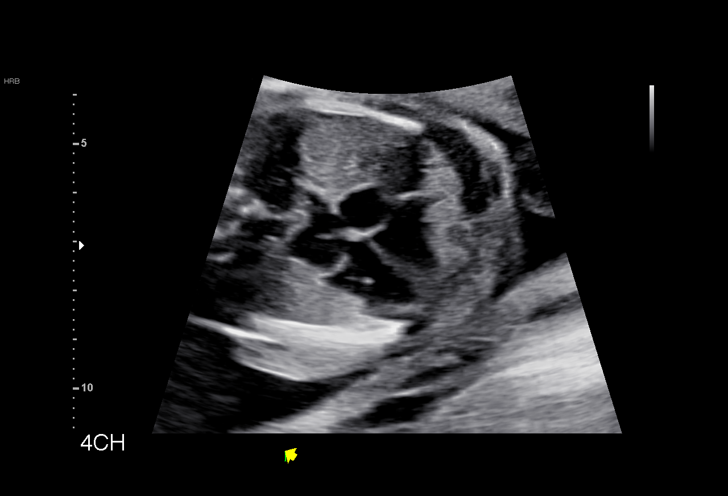
[im 39/48]
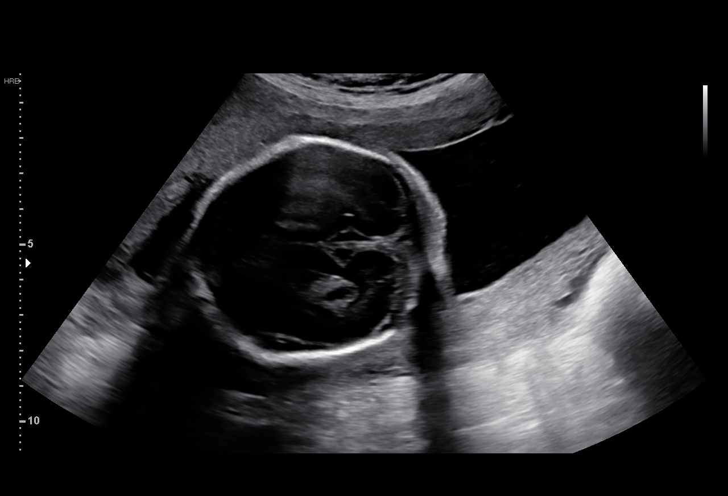
[im 42/48]
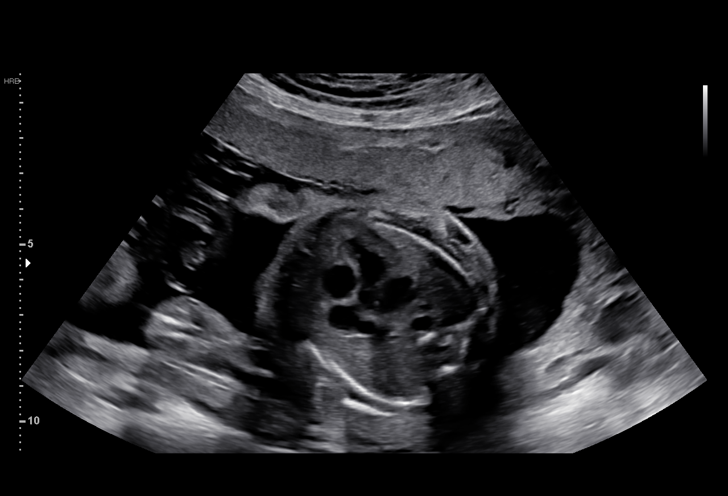
[im 46/48]
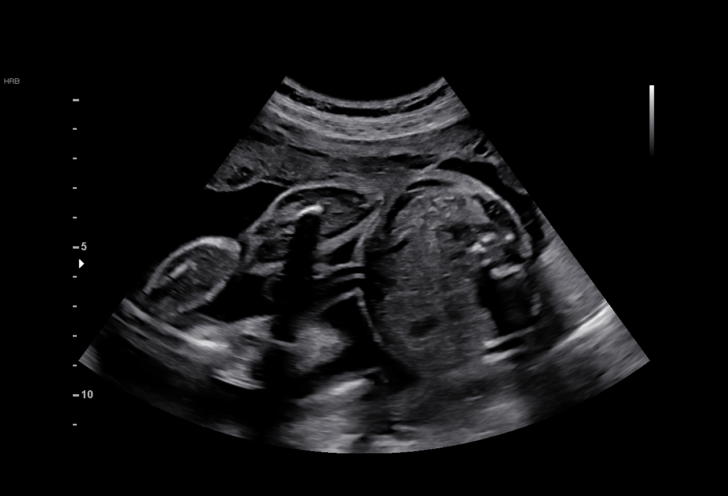

[13 of 28 positions shown; findings below may reference images not displayed]

----------------------------------------------------------------------

 ----------------------------------------------------------------------
Indications

  Abnormal ultrasound finding on antenatal
  screening of mother (EIF in LV, Bilateral
  Renal Pyelectasis, Bilateral CP Cysts)
  Encounter for antenatal screening for
  malformations
  Poor obstetric history: Previous gestational
  diabetes
  24 weeks gestation of pregnancy
 ----------------------------------------------------------------------
Vital Signs

 BMI:
Fetal Evaluation

 Num Of Fetuses:         1
 Fetal Heart Rate(bpm):  153
 Cardiac Activity:       Observed
 Presentation:           Variable
 Placenta:               Anterior
 P. Cord Insertion:      Previously Visualized

 Amniotic Fluid
 AFI FV:      Within normal limits

                             Largest Pocket(cm)

Biometry

 BPD:      63.7  mm     G. Age:  25w 5d         79  %    CI:        71.54   %    70 - 86
                                                         FL/HC:      18.0   %    18.7 -
 HC:      239.8  mm     G. Age:  26w 0d         77  %    HC/AC:      1.11        1.04 -
 AC:      216.6  mm     G. Age:  26w 1d         83  %    FL/BPD:     67.8   %    71 - 87
 FL:       43.2  mm     G. Age:  24w 1d         21  %    FL/AC:      19.9   %    20 - 24
 HUM:      42.3  mm     G. Age:  25w 3d         60  %
 LV:        7.4  mm

 Est. FW:     806  gm    1 lb 12 oz      72  %
OB History

 Blood Type:    O+
 Gravidity:    2         Term:   1        Prem:   0        SAB:   0
 TOP:          0       Ectopic:  0        Living: 1
Gestational Age

 LMP:           24w 5d        Date:  11/12/18                 EDD:   08/19/19
 U/S Today:     25w 4d                                        EDD:   08/13/19
 Best:          24w 5d     Det. By:  LMP  (11/12/18)          EDD:   08/19/19
Anatomy

 Cranium:               Appears normal         Aortic Arch:            Previously seen
 Cavum:                 Appears normal         Ductal Arch:            Previously seen
 Ventricles:            Appears normal         Diaphragm:              Appears normal
 Choroid Plexus:        Bilateral choroid      Stomach:                Appears normal, left
                        plexus cysts
                                                                       sided
 Cerebellum:            Previously seen        Abdomen:                Appears normal
 Posterior Fossa:       Previously seen        Abdominal Wall:         Previously seen
 Nuchal Fold:           Previously seen        Cord Vessels:           Previously seen
 Face:                  Orbits and profile     Kidneys:                Bilat UTD, Rt
                        previously seen
                                                                       7.6mm, Lt 4.8mm
 Lips:                  Previously seen        Bladder:                Appears normal
 Thoracic:              Appears normal         Spine:                  Previously seen
 Heart:                 Echogenic focus        Upper Extremities:      Previously seen
                        in LV
 RVOT:                  Previously seen        Lower Extremities:      Previously seen
 LVOT:                  Appears normal

 Other:  Male gender. Nasal bone visualized. Open hands visualized. Heels
         and 5th digit visualized. Technically difficult due to fetal position.
Cervix Uterus Adnexa

 Cervix
 Length:           4.84  cm.
 Normal appearance by transabdominal scan.
Comments

 This patient was seen for a follow up exam as an echogenic
 focus, bilateral choroid plexus cysts, and bilateral pyelectasis
 was noted on her prior ultrasound exams.  The patient reports
 that her cell free DNA test indicated a low risk for trisomy 21,
 18, and 13.  A male fetus is predicted.  She denies any
 problems since her last exam.
 She was informed that the fetal growth and amniotic fluid
 level appears appropriate for her gestational age.
 The choroid plexus cysts appear to be resolving.  An
 echogenic focus is still noted in the left ventricle of the fetal
 heart today.
 Mild bilateral pyelectasis measuring 0.5 to 0.6 cm continues
 to be noted.  A normal-appearing filled fetal bladder and
 normal amniotic fluid were noted.  The patient was reassured
 that this is most likely a normal variant and commonly seen in
 male fetuses.  She was advised that should the mild
 pyelectasis remain the same at her next exam in the third
 trimester, that this would be considered a normal variant also.
 A follow up exam was scheduled in 4 weeks.

## 2021-02-20 IMAGING — US US MFM OB FOLLOW-UP
1 series · 13 of 28 positions shown · non-contrast
Comparison: none

[Series 1: us mfm ob follow-up · 45 acquisitions, 13 frames shown]
[im 2/45]
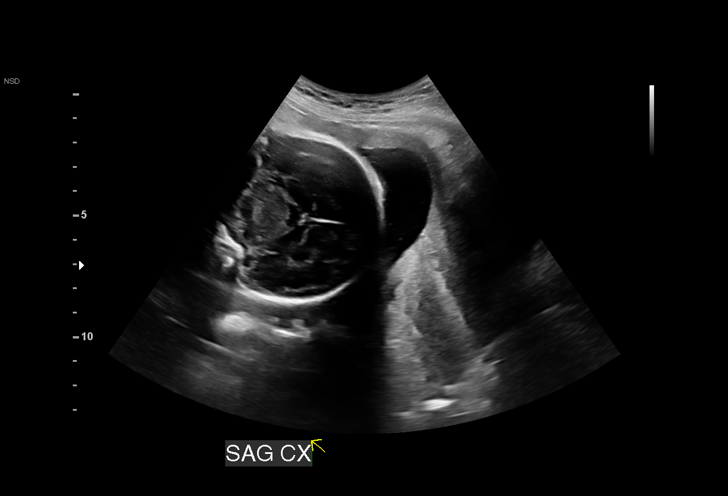
[im 5/45]
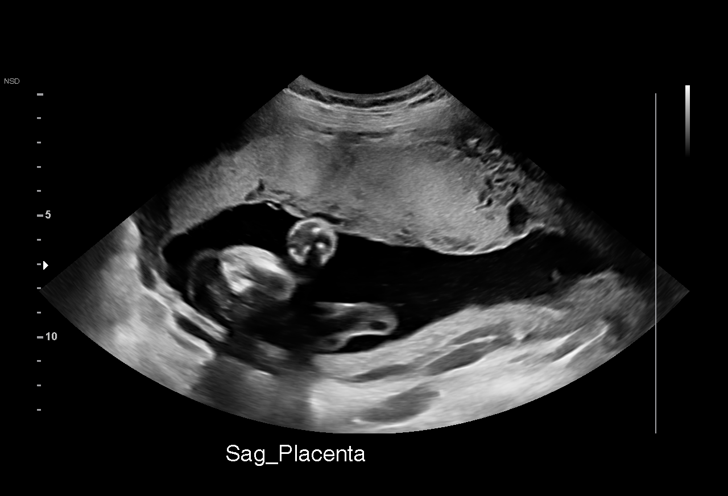
[im 9/45]
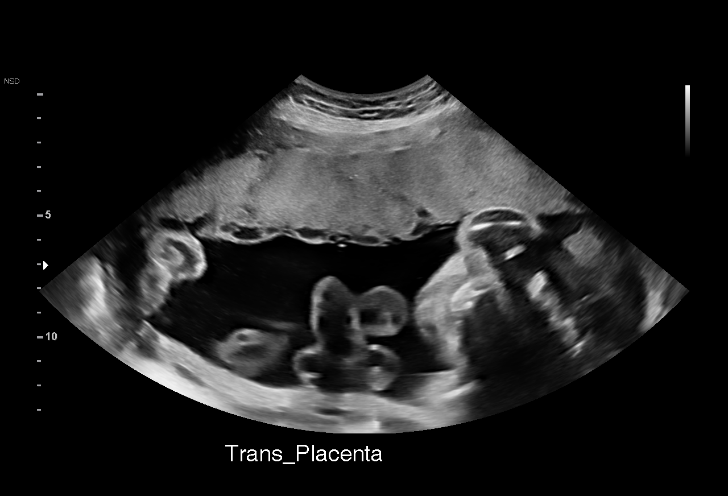
[im 12/45]
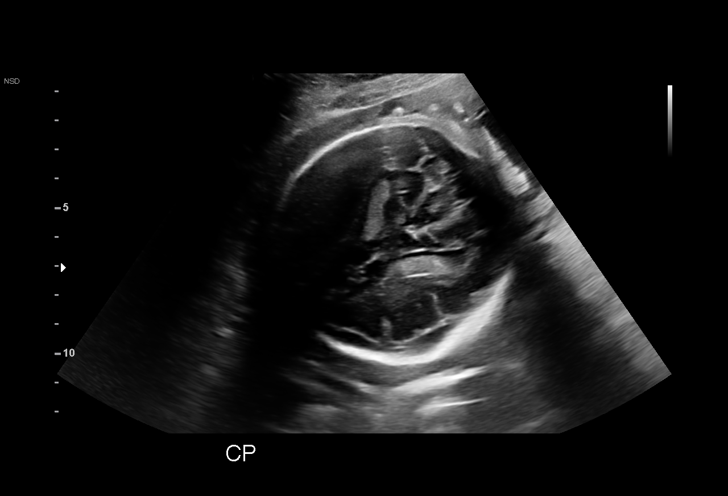
[im 15/45]
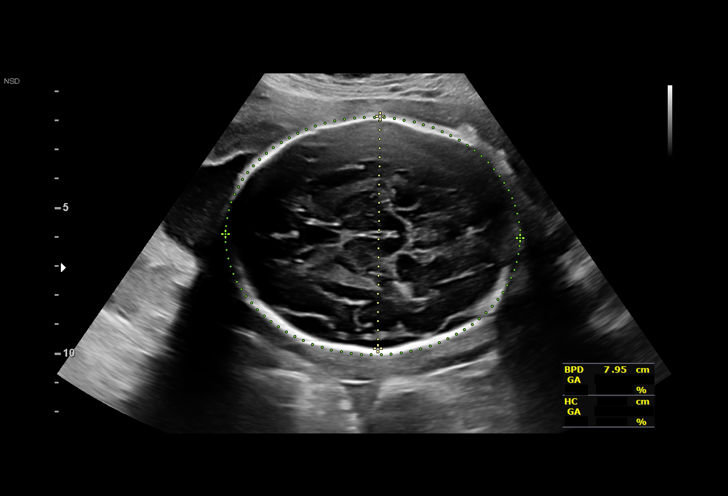
[im 18/45]
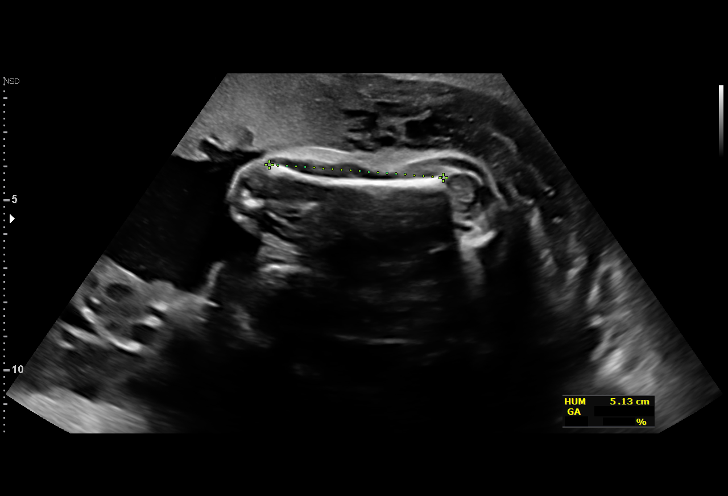
[im 23/45]
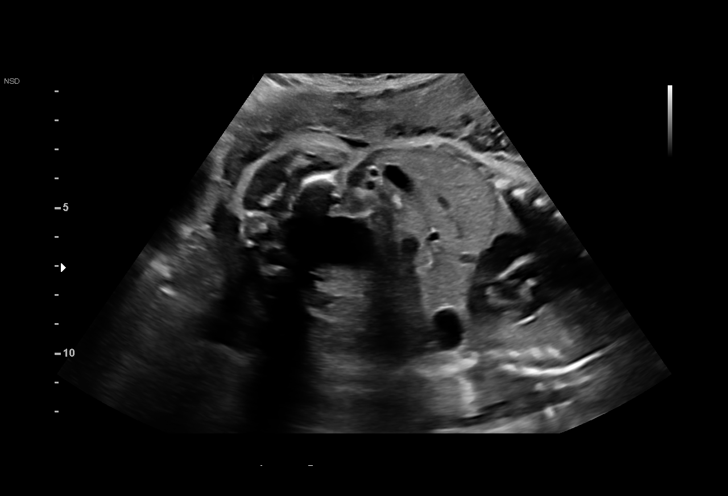
[im 27/45]
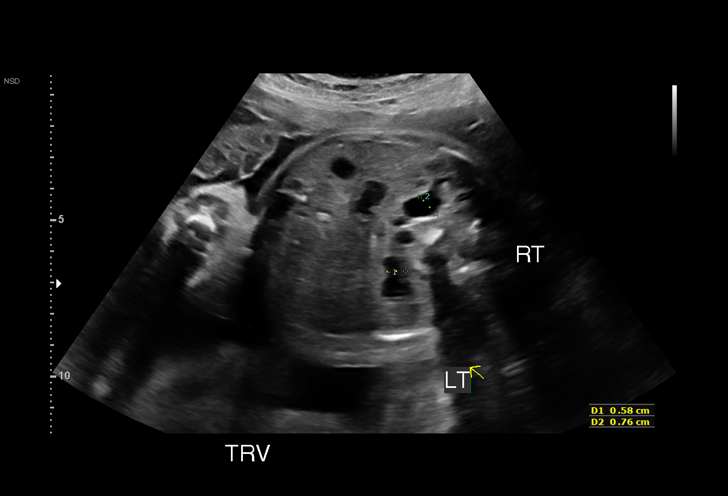
[im 30/45]
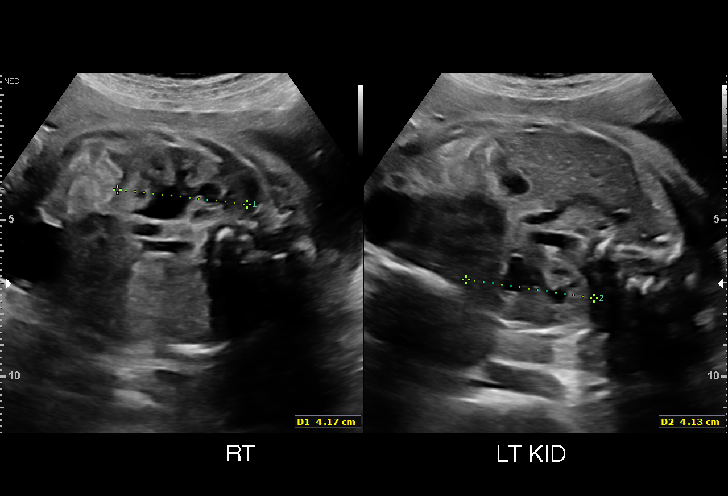
[im 33/45]
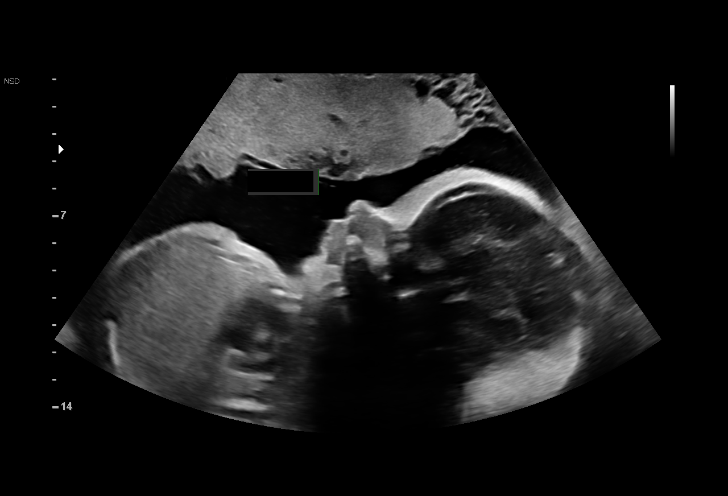
[im 36/45]
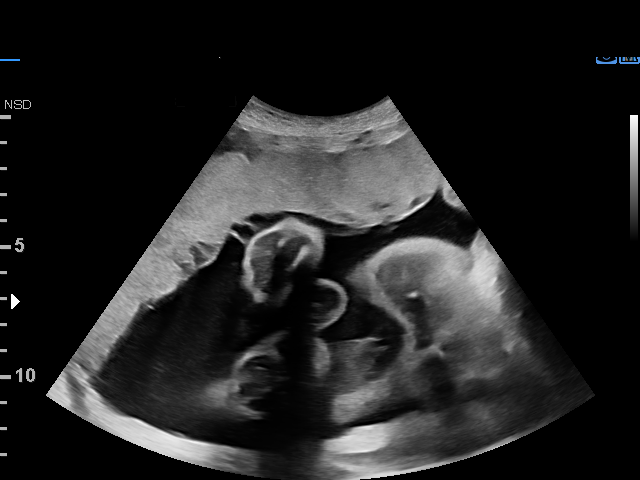
[im 40/45]
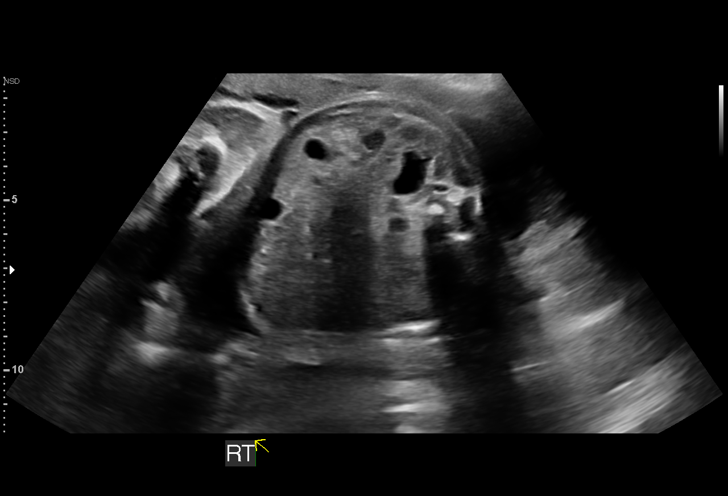
[im 43/45]
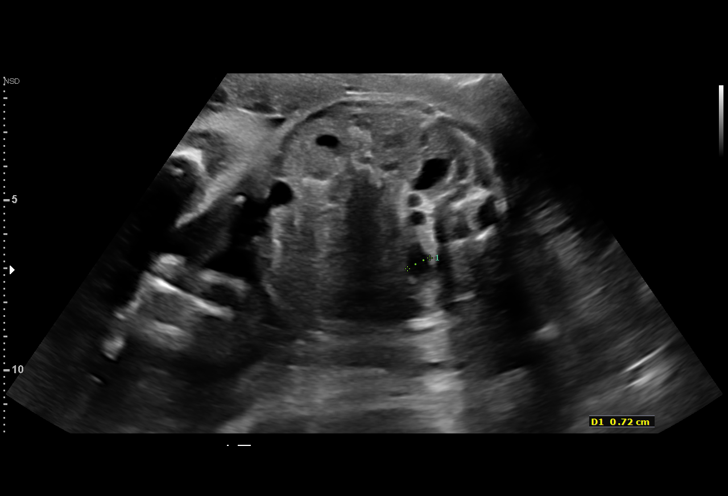

[13 of 28 positions shown; findings below may reference images not displayed]

----------------------------------------------------------------------

 ----------------------------------------------------------------------
Indications

  Abnormal ultrasound finding on antenatal
  screening of mother (EIF in LV, Bilateral
  Renal Pyelectasis, Bilateral CP Cysts)
  29 weeks gestation of pregnancy
  Encounter for antenatal screening for
  malformations
  Poor obstetric history: Previous gestational
  diabetes
 ----------------------------------------------------------------------
Vital Signs

                                                Height:        5'4"
Fetal Evaluation

 Num Of Fetuses:         1
 Fetal Heart Rate(bpm):  150
 Cardiac Activity:       Observed
 Presentation:           Cephalic
 Placenta:               Anterior
 P. Cord Insertion:      Previously Visualized

 Amniotic Fluid
 AFI FV:      Within normal limits

 AFI Sum(cm)     %Tile       Largest Pocket(cm)
 18.82           73

 RUQ(cm)       RLQ(cm)       LUQ(cm)        LLQ(cm)

Biometry
 BPD:      79.4  mm     G. Age:  31w 6d         98  %    CI:        74.94   %    70 - 86
                                                         FL/HC:      18.7   %    19.6 -
 HC:       291   mm     G. Age:  32w 0d         94  %    HC/AC:      1.10        0.99 -
 AC:      263.6  mm     G. Age:  30w 3d         85  %    FL/BPD:     68.5   %    71 - 87
 FL:       54.4  mm     G. Age:  28w 5d         28  %    FL/AC:      20.6   %    20 - 24
 HUM:      51.4  mm     G. Age:  30w 0d         66  %

 LV:        7.6  mm

 Est. FW:    2920  gm      3 lb 6 oz     80  %
OB History

 Blood Type:    O+
 Gravidity:    2         Term:   1        Prem:   0        SAB:   0
 TOP:          0       Ectopic:  0        Living: 1
Gestational Age

 LMP:           29w 0d        Date:  11/12/18                 EDD:   08/19/19
 U/S Today:     30w 5d                                        EDD:   08/07/19
 Best:          29w 0d     Det. By:  LMP  (11/12/18)          EDD:   08/19/19
Anatomy

 Cranium:               Appears normal         Aortic Arch:            Previously seen
 Cavum:                 Previously seen        Ductal Arch:            Previously seen
 Ventricles:            Appears normal         Diaphragm:              Appears normal
 Choroid Plexus:        Resolved CPC           Stomach:                Appears normal, left
                                                                       sided
 Cerebellum:            Previously seen        Abdomen:                Appears normal
 Posterior Fossa:       Previously seen        Abdominal Wall:         Previously seen
 Nuchal Fold:           Previously seen        Cord Vessels:           Previously seen
 Face:                  Orbits and profile     Kidneys:                Bilat UTD, Rt 7
                        previously seen
                                                                       mm, Lt 7 mm
 Lips:                  Previously seen        Bladder:                Appears normal
 Thoracic:              Appears normal         Spine:                  Previously seen
 Heart:                 Echogenic focus        Upper Extremities:      Previously seen
                        in LV
 RVOT:                  Previously seen        Lower Extremities:      Previously seen
 LVOT:                  Previously seen

 Other:  Male gender. Nasal bone, Open hands, Heels and 5th digit previously
         visualized. Technically difficult due to fetal position.
Cervix Uterus Adnexa

 Cervix
 Length:           4.15  cm.
 Normal appearance by transabdominal scan.

 Left Ovary
 Previously seen.

 Right Ovary
 Previously seen
Impression

 Patient returns for fetal growth assessment and evaluation of
 kidneys.  On fetal anatomy scan the bilateral urinary tract
 dilations were seen.  She also had bilateral choroid plexus
 cysts and echogenic focus in the left ventricle.
 On cell free fetal DNA screening, the risks of fetal
 aneuploidies are not increased.

 On today's ultrasound, fetal growth is appropriate for
 gestational age.  Amniotic fluid is normal and good fetal
 activity seen.  Bilateral mild urinary tract dilations (7 mm
 each) are seen.  Both kidneys, otherwise, look normal with no
 increased echogenicities.  Choroid plexus cysts have
 resolved.

 I reassured the patient of the findings that mild urinary tract
 dilations resolve in most cases.
Recommendations

 -An appointment was made for her to return in 6 weeks for
 fetal growth and renal assessments.
                 Lindquist, Per Normann

## 2021-02-21 ENCOUNTER — Ambulatory Visit: Admitting: Cardiology

## 2021-03-31 ENCOUNTER — Ambulatory Visit: Admitting: General Practice

## 2021-04-16 DIAGNOSIS — E0500 Thyrotoxicosis with diffuse goiter without thyrotoxic crisis or storm: Secondary | ICD-10-CM

## 2021-04-16 HISTORY — DX: Thyrotoxicosis with diffuse goiter without thyrotoxic crisis or storm: E05.00

## 2021-05-25 ENCOUNTER — Ambulatory Visit: Admitting: General Practice

## 2021-08-06 NOTE — Progress Notes (Signed)
?Cardiology Office Note:   ? ?Date:  08/09/2021  ? ?ID:  Martha Gutierrez, DOB Feb 23, 1991, MRN [id number] ? ?PCP:  Shirlean Mylar, MD ?  ?[facility] Providers ?Cardiologist:  None    ? ?Referring MD: Martha Gutierrez Family M*  ? ?CC: Palpitations ? ?History of Present Illness:   ? ?Martha Gutierrez is a 31 y.o. female with a hx of hyperthyroidism who presents for follow-up.  She was initially seen 09/2020 for palpitations/chest pain, which were thought to be due to a recently diagnosed hyperthyroidism.  Echocardiogram 10/27/2020 showed normal biventricular function, no significant valvular disease.  Zio patch x7 days was ordered but was not done. ? ?Since last clinic visit, she reports that she is doing much better.  She was diagnosed with Graves' disease and started on methimazole.  Reports palpitations have resolved since her hyperthyroidism was treated.  She works out regularly.  Recently went for 2 mile run, denied any exertional chest pain or dyspnea.  Denies any lightheadedness, syncope, or lower extremity edema. ? ? ?Past Medical History:  ?Diagnosis Date  ? Gestational diabetes   ? H/O gestational diabetes in prior pregnancy, currently pregnant   ? ? ?Past Surgical History:  ?Procedure Laterality Date  ? APPENDECTOMY    ? LAPAROSCOPIC APPENDECTOMY  08/18/2011  ? Procedure: APPENDECTOMY LAPAROSCOPIC;  Surgeon: Emelia Loron, MD;  Location: [facility];  Service: General;  Laterality: N/A;  ? None    ? WISDOM TOOTH EXTRACTION    ? ? ?Current Medications: ?Current Meds  ?Medication Sig  ? methimazole (TAPAZOLE) 10 MG tablet 10 mg daily.  ? MISC NATURAL PRODUCT NASAL NA daily.  ? Omega-3 Fatty Acids (FISH OIL) 600 MG CAPS daily.  ? selenium 200 MCG TABS tablet 200 mcg daily.  ?  ? ?Allergies:   Patient has no known allergies.  ? ?Social History  ? ?Socioeconomic History  ? Marital status: Married  ?  Spouse name: Not on file  ? Number of children: Not on file  ? Years of education: Not on file  ? Highest  education level: Not on file  ?Occupational History  ? Not on file  ?Tobacco Use  ? Smoking status: Never  ? Smokeless tobacco: Never  ?Vaping Use  ? Vaping Use: Never used  ?Substance and Sexual Activity  ? Alcohol use: Not Currently  ?  Comment: socially  ? Drug use: No  ? Sexual activity: Yes  ?Other Topics Concern  ? Not on file  ?Social History Narrative  ? Not on file  ? ?Social Determinants of Health  ? ?Financial Resource Strain: Not on file  ?Food Insecurity: Not on file  ?Transportation Needs: Not on file  ?Physical Activity: Not on file  ?Stress: Not on file  ?Social Connections: Not on file  ?  ? ?Family History: ?The patient's family history includes Cancer in her mother. ? ?ROS:   ?Please see the history of present illness.    ? ?All other systems reviewed and are negative. ? ?EKGs/Labs/Other Studies Reviewed:   ? ?The following studies were reviewed today: ?No prior CV studies available. ? ?EKG:   ?08/09/2021: Normal sinus rhythm, rate 79, no ST abnormalities ?10/06/2020: Sinus tachycardia rhythm. Rate 111 bpm. No ST abnormalities.  ? ?Recent Labs: ?09/27/2020: TSH 0.01  ?Recent Lipid Panel ?No results found for: CHOL, TRIG, HDL, CHOLHDL, VLDL, LDLCALC, LDLDIRECT ? ? ? ?Physical Exam:   ? ?VS:  BP 114/60   Pulse 79   Ht 5\' 4"  (1.626  m)   Wt 127 lb 9.6 oz (57.9 kg)   SpO2 98%   BMI 21.90 kg/m?    ? ?Wt Readings from Last 3 Encounters:  ?08/09/21 127 lb 9.6 oz (57.9 kg)  ?10/12/20 117 lb (53.1 kg)  ?10/07/20 116 lb (52.6 kg)  ?  ? ?GEN: Well nourished, well developed in no acute distress ?HEENT: Normal ?NECK: No JVD; No carotid bruits ?LYMPHATICS: No lymphadenopathy ?CARDIAC: RRR, no murmurs, rubs, gallops ?RESPIRATORY:  Clear to auscultation without rales, wheezing or rhonchi  ?ABDOMEN: Soft, non-tender, non-distended ?MUSCULOSKELETAL:  No edema; No deformity  ?SKIN: Warm and dry ?NEUROLOGIC:  Alert and oriented x 3 ?PSYCHIATRIC:  Normal affect  ? ?ASSESSMENT:   ? ?1. Palpitations   ?2. Chest pain of  uncertain etiology   ?3. Graves disease   ? ? ?PLAN:   ? ?Palpitations/chest pain: Suspect symptoms due to hyperthyroidism, TSH less than 0.01 on 09/26/2020.  Follows with endocrinology, started on methimazole.  Most recent TSH 0.56 on 07/20/2021.  Echocardiogram 10/27/2020 showed normal biventricular function, no significant valvular disease.  Zio patch x7 days was ordered but was not done. ?-Reports symptoms have resolved with treatment of hyperthyroidism, no further cardiac work-up recommended at this time ?-Requesting referral to endocrinology as her current endocrinologist is leaving the area.  Will refer ? ?RTC as needed ? ?Medication Adjustments/Labs and Tests Ordered: ?Current medicines are reviewed at length with the patient today.  Concerns regarding medicines are outlined above.  ?Orders Placed This Encounter  ?Procedures  ? Ambulatory referral to Endocrinology  ? EKG 12-Lead  ? ?No orders of the defined types were placed in this encounter. ? ? ? ?Patient Instructions  ?Medication Instructions:  ?Your physician recommends that you continue on your current medications as directed. Please refer to the Current Medication list given to you today. ? ?Follow-Up: ?At [facility], you and your health needs are our priority.  As part of our continuing mission to provide you with exceptional heart care, we have created designated Provider Care Teams.  These Care Teams include your primary Cardiologist (physician) and Advanced Practice Providers (APPs -  Physician Assistants and Nurse Practitioners) who all work together to provide you with the care you need, when you need it. ? ?We recommend signing up for the patient portal called "MyChart".  Sign up information is provided on this After Visit Summary.  MyChart is used to connect with patients for Virtual Visits (Telemedicine).  Patients are able to view lab/test results, encounter notes, upcoming appointments, etc.  Non-urgent messages can be sent to your provider  as well.   ?To learn more about what you can do with MyChart, go to [email or web address].   ? ?Your next appointment:   ?AS NEEDED with Dr. Bjorn Pippin ? ?Other Instructions ?You have been referred to Endocrinology ?--they will call to schedule appointment ? ? ?Important Information About Sugar ? ? ? ? ? ?  ? ? ? ? ?Signed, ?Little Ishikawa, MD  ?08/09/2021 8:55 AM    ?[facility] ?

## 2021-08-09 ENCOUNTER — Ambulatory Visit (INDEPENDENT_AMBULATORY_CARE_PROVIDER_SITE_OTHER): Admitting: Cardiology

## 2021-08-09 ENCOUNTER — Encounter: Payer: Self-pay | Admitting: Cardiology

## 2021-08-09 VITALS — BP 114/60 | HR 79 | Ht 64.0 in | Wt 127.6 lb

## 2021-08-09 DIAGNOSIS — E0500 Thyrotoxicosis with diffuse goiter without thyrotoxic crisis or storm: Secondary | ICD-10-CM

## 2021-08-09 DIAGNOSIS — R002 Palpitations: Secondary | ICD-10-CM | POA: Diagnosis not present

## 2021-08-09 DIAGNOSIS — R079 Chest pain, unspecified: Secondary | ICD-10-CM | POA: Diagnosis not present

## 2021-08-09 NOTE — Patient Instructions (Signed)
Medication Instructions:  ?Your physician recommends that you continue on your current medications as directed. Please refer to the Current Medication list given to you today. ? ?Follow-Up: ?At [facility], you and your health needs are our priority.  As part of our continuing mission to provide you with exceptional heart care, we have created designated Provider Care Teams.  These Care Teams include your primary Cardiologist (physician) and Advanced Practice Providers (APPs -  Physician Assistants and Nurse Practitioners) who all work together to provide you with the care you need, when you need it. ? ?We recommend signing up for the patient portal called "MyChart".  Sign up information is provided on this After Visit Summary.  MyChart is used to connect with patients for Virtual Visits (Telemedicine).  Patients are able to view lab/test results, encounter notes, upcoming appointments, etc.  Non-urgent messages can be sent to your provider as well.   ?To learn more about what you can do with MyChart, go to [email or web address].   ? ?Your next appointment:   ?AS NEEDED with Dr. Bjorn Pippin ? ?Other Instructions ?You have been referred to Endocrinology ?--they will call to schedule appointment ? ? ?Important Information About Sugar ? ? ? ? ? ? ?

## 2021-11-29 LAB — OB RESULTS CONSOLE RUBELLA ANTIBODY, IGM: Rubella: NON-IMMUNE/NOT IMMUNE

## 2021-11-29 LAB — OB RESULTS CONSOLE HGB/HCT, BLOOD
HCT: 37 (ref 29–41)
Hemoglobin: 13.2

## 2021-11-29 LAB — OB RESULTS CONSOLE GC/CHLAMYDIA
Chlamydia: NEGATIVE
Neisseria Gonorrhea: NEGATIVE

## 2021-11-29 LAB — OB RESULTS CONSOLE PLATELET COUNT: Platelets: 364

## 2021-11-29 LAB — OB RESULTS CONSOLE HEPATITIS B SURFACE ANTIGEN: Hepatitis B Surface Ag: NEGATIVE

## 2021-11-29 LAB — OB RESULTS CONSOLE HIV ANTIBODY (ROUTINE TESTING): HIV: NONREACTIVE

## 2021-11-29 LAB — OB RESULTS CONSOLE RPR: RPR: NONREACTIVE

## 2021-11-29 LAB — OB RESULTS CONSOLE VARICELLA ZOSTER ANTIBODY, IGG: Varicella: IMMUNE

## 2021-11-29 LAB — HEPATITIS C ANTIBODY: HCV Ab: NEGATIVE

## 2021-11-29 LAB — OB RESULTS CONSOLE GBS: GBS: NEGATIVE

## 2022-01-16 ENCOUNTER — Other Ambulatory Visit: Payer: Self-pay | Admitting: Obstetrics & Gynecology

## 2022-01-16 DIAGNOSIS — Z363 Encounter for antenatal screening for malformations: Secondary | ICD-10-CM

## 2022-01-18 ENCOUNTER — Encounter: Payer: Self-pay | Admitting: *Deleted

## 2022-01-23 ENCOUNTER — Ambulatory Visit: Admitting: *Deleted

## 2022-01-23 ENCOUNTER — Ambulatory Visit: Attending: Maternal & Fetal Medicine | Admitting: Maternal & Fetal Medicine

## 2022-01-23 ENCOUNTER — Ambulatory Visit

## 2022-01-23 ENCOUNTER — Ambulatory Visit: Attending: Obstetrics and Gynecology

## 2022-01-23 VITALS — BP 125/64 | HR 92

## 2022-01-23 DIAGNOSIS — Z3A18 18 weeks gestation of pregnancy: Secondary | ICD-10-CM

## 2022-01-23 DIAGNOSIS — Z363 Encounter for antenatal screening for malformations: Secondary | ICD-10-CM | POA: Diagnosis present

## 2022-01-23 DIAGNOSIS — O99282 Endocrine, nutritional and metabolic diseases complicating pregnancy, second trimester: Secondary | ICD-10-CM | POA: Diagnosis not present

## 2022-01-23 DIAGNOSIS — O34592 Maternal care for other abnormalities of gravid uterus, second trimester: Secondary | ICD-10-CM

## 2022-01-23 DIAGNOSIS — E0590 Thyrotoxicosis, unspecified without thyrotoxic crisis or storm: Secondary | ICD-10-CM

## 2022-01-23 DIAGNOSIS — O280 Abnormal hematological finding on antenatal screening of mother: Secondary | ICD-10-CM | POA: Insufficient documentation

## 2022-01-23 DIAGNOSIS — O0990 Supervision of high risk pregnancy, unspecified, unspecified trimester: Secondary | ICD-10-CM

## 2022-01-23 DIAGNOSIS — O09292 Supervision of pregnancy with other poor reproductive or obstetric history, second trimester: Secondary | ICD-10-CM

## 2022-01-23 DIAGNOSIS — O3513X0 Maternal care for (suspected) chromosomal abnormality in fetus, trisomy 21, not applicable or unspecified: Secondary | ICD-10-CM | POA: Diagnosis not present

## 2022-01-23 DIAGNOSIS — O358XX0 Maternal care for other (suspected) fetal abnormality and damage, not applicable or unspecified: Secondary | ICD-10-CM | POA: Diagnosis not present

## 2022-01-23 NOTE — Progress Notes (Signed)
   [facility] at [facility], [address] Phone:  [phone]   Fax:  [phone]    Name: Martha Gutierrez [facility] Indication: Discuss risk for aneuploidy  DOB: 06-Jan-1991 Age: 31 y.o.   EDC: 06/22/2022 LMP: 08/18/2021 Referring Provider:  Maurice Small, MD  EGA: [redacted]w[redacted]d Genetic Counselor: Staci Righter, MS, CGC  OB Hx: [id number] Date of Appointment: 01/23/2022  Accompanied by: Her reproductive partner Face to Face Time: 25 Minutes   [name] was scheduled for formal genetic counseling on 01/23/22 after her ultrasound appointment to discuss her high risk quad screen. [name] and her partner declined formal genetic counseling including a discussion of family history today and opted for a brief discussion with genetic counseling in the ultrasound room after the ultrasound examination. We discussed that [name]'s quad screen indicated an increased chance for the current pregnancy to have Down syndrome (1 in 101 chance). We discussed that cfDNA screening and amniocentesis are available to [name] given this high risk result. Today the couple reported to genetic counseling that [facility] previously completed cfDNA screening and the result is low risk. Genetic counseling reviewed this result after meeting with the couple to verify that it is low risk. The result will be scanned into the Media tab of Epic. We discussed that a low risk cfDNA screen significantly reduces the chance that the current pregnancy has Down syndrome, Trisomy 37, Trisomy 52, and common sex chromosome conditions. With this result it is not likely for the pregnancy to have Down syndrome, however, cfDNA screening is only a screening test and residual risk remains. Given the residual risk for aneuploidy we offered [name] amniocentesis for prenatal diagnosis which she declined. If [name] and her partner wish to pursue formal genetic counseling in the future this option remains available to them.    Thank you for sharing  in the care of [facility] with [place].  Please do not hesitate to contact us if you have any questions.  Staci Righter, MS, [facility]

## 2022-01-23 NOTE — Progress Notes (Signed)
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 G3P2002 at [redacted]w[redacted]d here for ultrasound consultation.   RE abnormal quad screen: I discussed the clinical significance of a "positive" test for the quad screen. While the detection rate for trisomy 21 is 75-80% with the quad screen there is a false positive rate of 5%. Up to 60% of trisomy 18 can be detected with a quad test. 80% of open neural tube defects can also be detected on a quad test. She also had a low risk cell-free DNA test and a normal appearing detailed ultrasound, which is very reassuring.   In addition to aneuploidy screening, there are various obstetric complications associated with an abnormal quad screen. Based on her lab results, it appears the positive test is driven by an elevated b-hCG and inhibin A. The b-hCG is 2.73 (abnormal is > 2 MoM). While data are conflicting some studies show an increased risk for preeclampsia, preterm birth, low birth weight, fetal demise, and possibly hypertension. Her inhibitin-A level is 3.46 (abnormal is > 2 MoM). Elevated inhibin-A levels at 15 weeks of gestation or more has been associated with later development of preeclampsia, FGR, preterm birth, and stillbirth. The association between elevated inhibin-A levels greater than 2.0 MoM and these adverse obstetric outcomes is reported to be independent of the other analyte deviation. Although there are many significant associations between abnormal first-trimester and second-trimester maternal serum markers and adverse obstetric outcomes, the sensitivity and PPVs for the individual outcomes are too low for them to be clinically useful as screening tests. I discussed that we follow these pregnancies with serial growth ultrasounds and antenatal testing.  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 The external uterine contour is not well seen, making the final diagnosis impossible but I favor a partial septate uterus based on the limited views today. Neill Loft was unaware of this finding until today. I discussed the association with renal anomalies as well as increased risk for preeclampsia, malpresentation and preterm birth. We discussed there is no intervention during pregnancy other than monitoring with ultrasound and symptoms.   Review of Systems: A review of systems was performed and was negative except per HPI   Vitals and Physical Exam See intake sheet for vitals Sitting comfortably on the sonogram table Nonlabored breathing Normal rate and rhythm Abdomen is nontender  Genetic testing: +Quad, low risk NIPS  Sonographic findings Single intrauterine pregnancy. Observed fetal cardiac activity. Cephalic presentation. Fetal anatomy that was well seen appears normal without evidence of soft markers. Not all fetal structures were well seen due to a technically diffcult exam and the anatomic survey remains incomplete with limited views of the heart.  Fetal biometry shows the estimated fetal weight at the 48 percentile.  Amniotic fluid volume: Within norpreemal limits. Uterus: the uterus appears to be either a partial bicornuate or partial septate uterus based on the thick indentation of uterine tissue on the intracavitary side of the fundus. The external uterine contour is not well seen, making the final diagnosis impossible but I favor a partial septate uterus based on the limited views today.  Placenta: Anterior Fundal. Cervix: Normal appearance by transabdominal scan. with a cervical length of 4.3  cm. Adnexa: No abnormality visualized.  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 She will discuss if she wants to have official counseling with our GC after the visit - I discussed carrier screening and she will consider this but is undecided at this time.    - Follow-up growth [place] in 4 weeks and then every 4 weeks after  - Antenatal testing around [redacted] weeks gestation  - Consider transvaginal pelvic ultrasound or MRI after the postpartum peroid to assess the shape of the uterus    I spent 60 minutes reviewing the patients chart, including labs and images as well as counseling the patient about her medical conditions.  Braxton Feathers  MFM, [place]   01/23/2022  1:11 PM

## 2022-01-24 ENCOUNTER — Other Ambulatory Visit: Payer: Self-pay

## 2022-01-24 ENCOUNTER — Other Ambulatory Visit: Payer: Self-pay | Admitting: *Deleted

## 2022-01-24 DIAGNOSIS — O280 Abnormal hematological finding on antenatal screening of mother: Secondary | ICD-10-CM

## 2022-02-20 ENCOUNTER — Ambulatory Visit: Admitting: *Deleted

## 2022-02-20 ENCOUNTER — Ambulatory Visit: Attending: Maternal & Fetal Medicine

## 2022-02-20 VITALS — BP 101/52 | HR 86

## 2022-02-20 DIAGNOSIS — O34592 Maternal care for other abnormalities of gravid uterus, second trimester: Secondary | ICD-10-CM

## 2022-02-20 DIAGNOSIS — E0590 Thyrotoxicosis, unspecified without thyrotoxic crisis or storm: Secondary | ICD-10-CM

## 2022-02-20 DIAGNOSIS — Q5128 Other doubling of uterus, other specified: Secondary | ICD-10-CM | POA: Insufficient documentation

## 2022-02-20 DIAGNOSIS — O289 Unspecified abnormal findings on antenatal screening of mother: Secondary | ICD-10-CM | POA: Diagnosis not present

## 2022-02-20 DIAGNOSIS — Z362 Encounter for other antenatal screening follow-up: Secondary | ICD-10-CM

## 2022-02-20 DIAGNOSIS — O3402 Maternal care for unspecified congenital malformation of uterus, second trimester: Secondary | ICD-10-CM | POA: Diagnosis present

## 2022-02-20 DIAGNOSIS — O280 Abnormal hematological finding on antenatal screening of mother: Secondary | ICD-10-CM | POA: Insufficient documentation

## 2022-02-20 DIAGNOSIS — O99282 Endocrine, nutritional and metabolic diseases complicating pregnancy, second trimester: Secondary | ICD-10-CM

## 2022-02-20 DIAGNOSIS — Z3A22 22 weeks gestation of pregnancy: Secondary | ICD-10-CM

## 2022-04-16 NOTE — L&D Delivery Note (Signed)
Delivery Note At 2244 a viable and healthy female was delivered over intact perineum via svd (Presentation: cephalic; OP ).  APGAR: 9,9 ; weight  not yet done.   Placenta status: delivered spontaneously ,intact .  Cord: 3vv  with the following complications: nuchal x1, loose and easily reduced after deliver of fetal head.  Anesthesia:  epidural Episiotomy:  none Lacerations:  2nd Suture Repair: 3.0 vicryl Est. Blood Loss (mL):  132m  Mom to postpartum.  Baby to Couplet care / Skin to Skin.  SCharyl Bigger3/06/2022, 11:25 PM

## 2022-04-30 ENCOUNTER — Inpatient Hospital Stay (HOSPITAL_COMMUNITY)
Admission: AD | Admit: 2022-04-30 | Discharge: 2022-04-30 | Disposition: A | Discharge status: Home / Self Care | Attending: Obstetrics and Gynecology | Admitting: Obstetrics and Gynecology

## 2022-04-30 ENCOUNTER — Encounter (HOSPITAL_COMMUNITY): Payer: Self-pay | Admitting: Obstetrics and Gynecology

## 2022-04-30 DIAGNOSIS — O368130 Decreased fetal movements, third trimester, not applicable or unspecified: Secondary | ICD-10-CM | POA: Diagnosis not present

## 2022-04-30 DIAGNOSIS — O321XX0 Maternal care for breech presentation, not applicable or unspecified: Secondary | ICD-10-CM | POA: Insufficient documentation

## 2022-04-30 DIAGNOSIS — Z3A32 32 weeks gestation of pregnancy: Secondary | ICD-10-CM | POA: Diagnosis not present

## 2022-04-30 DIAGNOSIS — Z711 Person with feared health complaint in whom no diagnosis is made: Secondary | ICD-10-CM | POA: Diagnosis not present

## 2022-04-30 DIAGNOSIS — Z3493 Encounter for supervision of normal pregnancy, unspecified, third trimester: Secondary | ICD-10-CM

## 2022-04-30 NOTE — MAU Provider Note (Signed)
History     CSN: [id number]  Arrival date and time: 04/30/22 0348   Event Date/Time   First Provider Initiated Contact with Patient 04/30/22 [phone]      Chief Complaint  Patient presents with   Decreased Fetal Movement   [address] Martha Gutierrez , a  32 y.o. [id number] at [redacted]w[redacted]d presents to MAU with complaints of decreased fetal movement for the last hour. Patient states she got up to pee and did not notice any movement. She states she drank some water, with minimal improvement. She noted that she tested positive on her QUAD screen for aneuploids and this baby is breech and she is concerned. Upon arrival to MAU she reports positive fetal movement, just "not as much as she normally feels." She denies vaginal bleeding, leaking of fluid, abnormal vaginal discharge and contractions.           OB History     Gravida  3   Para  2   Term  2   Preterm      AB      Living  2      SAB      IAB      Ectopic      Multiple  0   Live Births  2           Past Medical History:  Diagnosis Date   Gestational diabetes    H/O gestational diabetes in prior pregnancy, currently pregnant     Past Surgical History:  Procedure Laterality Date   APPENDECTOMY     LAPAROSCOPIC APPENDECTOMY  08/18/2011   Procedure: APPENDECTOMY LAPAROSCOPIC;  Surgeon: Rolm Bookbinder, MD;  Location: [facility];  Service: General;  Laterality: N/A;   None     WISDOM TOOTH EXTRACTION      Family History  Problem Relation Age of Onset   Cancer Mother        lymphoma   Stroke Maternal Grandfather    Asthma Neg Hx    Diabetes Neg Hx    Heart disease Neg Hx    Hypertension Neg Hx     Social History   Tobacco Use   Smoking status: Never   Smokeless tobacco: Never  Vaping Use   Vaping Use: Never used  Substance Use Topics   Alcohol use: Not Currently    Comment: socially   Drug use: No    Allergies: No Known Allergies  Medications Prior to Admission  Medication Sig Dispense Refill Last  Dose   magnesium oxide (MAG-OX) 400 (240 Mg) MG tablet Take 400 mg by mouth daily.      Prenatal Vit-Fe Fumarate-FA (PRENATAL MULTIVITAMIN) TABS tablet Take 1 tablet by mouth daily at 12 noon.      selenium 200 MCG TABS tablet 200 mcg daily.       Review of Systems  Constitutional:  Negative for chills, fatigue and fever.  Eyes:  Negative for pain and visual disturbance.  Respiratory:  Negative for apnea, shortness of breath and wheezing.   Cardiovascular:  Negative for chest pain and palpitations.  Gastrointestinal:  Negative for abdominal pain, constipation, diarrhea, nausea and vomiting.  Genitourinary:  Negative for difficulty urinating, dysuria, pelvic pain, vaginal bleeding, vaginal discharge and vaginal pain.  Musculoskeletal:  Negative for back pain.  Neurological:  Negative for seizures, weakness and headaches.  Psychiatric/Behavioral:  Negative for suicidal ideas.    Physical Exam   Blood pressure (!) 111/56, pulse 90, temperature 98 F (36.7 C), temperature source Oral, resp. rate  16, height 5\' 3"  (1.6 m), weight 70.3 kg, last menstrual period 09/15/2021, SpO2 100 %, unknown if currently breastfeeding.  Physical Exam Vitals and nursing note reviewed.  Constitutional:      General: She is not in acute distress.    Appearance: Normal appearance.  HENT:     Head: Normocephalic.  Pulmonary:     Effort: Pulmonary effort is normal.  Musculoskeletal:     Cervical back: Normal range of motion.  Skin:    General: Skin is warm and dry.     Capillary Refill: Capillary refill takes less than 2 seconds.  Neurological:     Mental Status: She is alert and oriented to person, place, and time.  Psychiatric:        Mood and Affect: Mood normal.    FHT: 130 bpm with moderate variability and 15x15 accels. No decels noted  Toco: occasional (patient unaware)   MAU Course  Procedures  Fetal movement clicker provided.   MDM - Patient hit fetal clicker >29 times while in MAU.  -  Patient reassured by findings.  - Plan for discharge   Assessment and Plan   1. Movement of fetus present during pregnancy in third trimester   2. [redacted] weeks gestation of pregnancy   3. Physically well but worried    - Reviewed Fetal kick counts in the 3rd trimester.  - Worsening signs and return precautions reviewed - Preterm labor precautions reviewed  - FHT reactive and reassuring upon time of discharge.  - Patient discharged home in stable condition and may return to MAU as needed.   Jacquiline Doe, MSN CNM  04/30/2022, 5:04 AM

## 2022-04-30 NOTE — MAU Note (Signed)
..  Martha Gutierrez is a 32 y.o. at [redacted]w[redacted]d here in MAU reporting: decreased fetal movement throughout the day and less at night. Denies vaginal bleeding, leaking of fluid, or pain.   Pain score: 0/10 Vitals:   04/30/22 0407  BP: (!) 111/56  Pulse: 90  Resp: 16  Temp: 98 F (36.7 C)  SpO2: 100%     FHT:146

## 2022-05-22 LAB — OB RESULTS CONSOLE GBS: GBS: NEGATIVE

## 2022-06-16 ENCOUNTER — Other Ambulatory Visit: Payer: Self-pay

## 2022-06-16 ENCOUNTER — Encounter (HOSPITAL_COMMUNITY): Payer: Self-pay | Admitting: Obstetrics and Gynecology

## 2022-06-16 ENCOUNTER — Inpatient Hospital Stay (HOSPITAL_COMMUNITY)
Admission: AD | Admit: 2022-06-16 | Discharge: 2022-06-16 | Disposition: A | Discharge status: Home / Self Care | Source: Home / Self Care | Attending: Obstetrics and Gynecology | Admitting: Obstetrics and Gynecology

## 2022-06-16 DIAGNOSIS — O479 False labor, unspecified: Secondary | ICD-10-CM

## 2022-06-16 DIAGNOSIS — O471 False labor at or after 37 completed weeks of gestation: Secondary | ICD-10-CM | POA: Insufficient documentation

## 2022-06-16 DIAGNOSIS — Z3A39 39 weeks gestation of pregnancy: Secondary | ICD-10-CM

## 2022-06-16 NOTE — MAU Note (Signed)
Martha Gutierrez is a 32 y.o. at 55w1dhere in MAU reporting: bright red VB that began today, states noticed with wiping.  Last intercourse 3 days ago.  Also reports having lower abdominal cramping.  Reports +FM, reports began moving while in waiting room.  Denies LOF.  Seen in office yesterday. LMP: NA Onset of complaint: today Pain score: 5 There were no vitals filed for this visit.   FHT:137 bpm Lab orders placed from triage:   None

## 2022-06-17 ENCOUNTER — Encounter (HOSPITAL_COMMUNITY): Payer: Self-pay | Admitting: Obstetrics and Gynecology

## 2022-06-17 ENCOUNTER — Inpatient Hospital Stay (HOSPITAL_COMMUNITY): Admitting: Anesthesiology

## 2022-06-17 ENCOUNTER — Inpatient Hospital Stay (HOSPITAL_COMMUNITY)
Admission: AD | Admit: 2022-06-17 | Discharge: 2022-06-19 | DRG: 807 | Disposition: A | Discharge status: Home / Self Care | Attending: Obstetrics and Gynecology | Admitting: Obstetrics and Gynecology

## 2022-06-17 ENCOUNTER — Other Ambulatory Visit: Payer: Self-pay

## 2022-06-17 DIAGNOSIS — O3663X0 Maternal care for excessive fetal growth, third trimester, not applicable or unspecified: Principal | ICD-10-CM | POA: Diagnosis present

## 2022-06-17 DIAGNOSIS — O26893 Other specified pregnancy related conditions, third trimester: Secondary | ICD-10-CM | POA: Diagnosis present

## 2022-06-17 DIAGNOSIS — O99284 Endocrine, nutritional and metabolic diseases complicating childbirth: Secondary | ICD-10-CM | POA: Diagnosis present

## 2022-06-17 DIAGNOSIS — E0590 Thyrotoxicosis, unspecified without thyrotoxic crisis or storm: Secondary | ICD-10-CM | POA: Diagnosis present

## 2022-06-17 DIAGNOSIS — Z3A39 39 weeks gestation of pregnancy: Secondary | ICD-10-CM

## 2022-06-17 LAB — CBC
HCT: 43.1 % (ref 36.0–46.0)
Hemoglobin: 14.7 g/dL (ref 12.0–15.0)
MCH: 29.5 pg (ref 26.0–34.0)
MCHC: 34.1 g/dL (ref 30.0–36.0)
MCV: 86.5 fL (ref 80.0–100.0)
Platelets: 302 10*3/uL (ref 150–400)
RBC: 4.98 MIL/uL (ref 3.87–5.11)
RDW: 14.1 % (ref 11.5–15.5)
WBC: 8.8 10*3/uL (ref 4.0–10.5)
nRBC: 0 % (ref 0.0–0.2)

## 2022-06-17 LAB — TYPE AND SCREEN
ABO/RH(D): O POS
Antibody Screen: NEGATIVE

## 2022-06-17 MED ORDER — LACTATED RINGERS IV SOLN
500.0000 mL | Freq: Once | INTRAVENOUS | Status: AC
  Administered 2022-06-17: 500 mL via INTRAVENOUS

## 2022-06-17 MED ORDER — DIPHENHYDRAMINE HCL 50 MG/ML IJ SOLN: 12.5000 mg | INTRAMUSCULAR | Status: DC | PRN

## 2022-06-17 MED ORDER — LIDOCAINE HCL (PF) 1 % IJ SOLN
INTRAMUSCULAR | Status: DC | PRN
  Administered 2022-06-17: 5 mL via EPIDURAL
  Administered 2022-06-17: 3 mL via EPIDURAL

## 2022-06-17 MED ORDER — LACTATED RINGERS IV SOLN: INTRAVENOUS | Status: DC

## 2022-06-17 MED ORDER — OXYCODONE-ACETAMINOPHEN 5-325 MG PO TABS: 2.0000 | ORAL_TABLET | ORAL | Status: DC | PRN

## 2022-06-17 MED ORDER — OXYTOCIN BOLUS FROM INFUSION
333.0000 mL | Freq: Once | INTRAVENOUS | Status: AC
  Administered 2022-06-17: 333 mL via INTRAVENOUS

## 2022-06-17 MED ORDER — LIDOCAINE HCL (PF) 1 % IJ SOLN
30.0000 mL | INTRAMUSCULAR | Status: AC | PRN
  Administered 2022-06-17: 30 mL via SUBCUTANEOUS
  Filled 2022-06-17: qty 30

## 2022-06-17 MED ORDER — EPHEDRINE 5 MG/ML INJ: 10.0000 mg | INTRAVENOUS | Status: DC | PRN

## 2022-06-17 MED ORDER — PHENYLEPHRINE 80 MCG/ML (10ML) SYRINGE FOR IV PUSH (FOR BLOOD PRESSURE SUPPORT): 80.0000 ug | PREFILLED_SYRINGE | INTRAVENOUS | Status: DC | PRN

## 2022-06-17 MED ORDER — FLEET ENEMA 7-19 GM/118ML RE ENEM: 1.0000 | ENEMA | RECTAL | Status: DC | PRN

## 2022-06-17 MED ORDER — LACTATED RINGERS IV SOLN
500.0000 mL | INTRAVENOUS | Status: DC | PRN
  Administered 2022-06-17: 250 mL via INTRAVENOUS

## 2022-06-17 MED ORDER — OXYCODONE-ACETAMINOPHEN 5-325 MG PO TABS: 1.0000 | ORAL_TABLET | ORAL | Status: DC | PRN

## 2022-06-17 MED ORDER — SOD CITRATE-CITRIC ACID 500-334 MG/5ML PO SOLN
30.0000 mL | ORAL | Status: DC | PRN
  Administered 2022-06-17: 30 mL via ORAL
  Filled 2022-06-17: qty 30

## 2022-06-17 MED ORDER — OXYTOCIN-SODIUM CHLORIDE 30-0.9 UT/500ML-% IV SOLN
2.5000 [IU]/h | INTRAVENOUS | Status: DC
  Administered 2022-06-17: 2.5 [IU]/h via INTRAVENOUS
  Filled 2022-06-17: qty 500

## 2022-06-17 MED ORDER — FENTANYL-BUPIVACAINE-NACL 0.5-0.125-0.9 MG/250ML-% EP SOLN
12.0000 mL/h | EPIDURAL | Status: DC | PRN
  Administered 2022-06-17: 12 mL/h via EPIDURAL
  Filled 2022-06-17: qty 250

## 2022-06-17 MED ORDER — ONDANSETRON HCL 4 MG/2ML IJ SOLN
4.0000 mg | Freq: Four times a day (QID) | INTRAMUSCULAR | Status: DC | PRN
  Administered 2022-06-17: 4 mg via INTRAVENOUS
  Filled 2022-06-17: qty 2

## 2022-06-17 MED ORDER — ACETAMINOPHEN 325 MG PO TABS: 650.0000 mg | ORAL_TABLET | ORAL | Status: DC | PRN

## 2022-06-17 MED ORDER — TERBUTALINE SULFATE 1 MG/ML IJ SOLN
INTRAMUSCULAR | Status: AC
  Filled 2022-06-17: qty 1

## 2022-06-17 NOTE — Progress Notes (Signed)
Comfortable Back pain has improved/resolved, now with pelvic pressure  Patient Vitals for the past 24 hrs:  BP Temp Temp src Pulse Resp SpO2 Height Weight  06/17/22 2031 114/78 -- -- 90 -- -- -- --  06/17/22 2001 (!) 104/58 -- -- 83 -- -- -- --  06/17/22 1941 115/72 98.1 F (36.7 C) Oral (!) 101 20 100 % '5\' 4"'$  (1.626 m) 74.4 kg  06/17/22 1936 (!) 145/70 -- -- 92 -- -- -- --  06/17/22 1925 110/66 -- -- 95 -- -- -- --  06/17/22 1920 108/60 -- -- 98 -- -- -- --  06/17/22 1915 112/63 -- -- (!) 101 -- -- -- --  06/17/22 1910 (!) 105/56 -- -- (!) 106 -- -- -- --  06/17/22 1905 (!) 96/54 -- -- 90 -- -- -- --  06/17/22 1900 119/71 -- -- 89 -- -- -- --  06/17/22 1859 128/73 -- -- 97 -- -- -- --  06/17/22 1827 125/75 98.3 F (36.8 C) Oral 96 18 100 % -- --  06/17/22 1757 128/78 98.8 F (37.1 C) Oral 98 20 99 % -- --   A&ox3 Nml respirations Abd: soft,nt,nd, gravid Cx: c/c/0 to +1; iupc placed LE: no edema, nt bilat  FHT: 120s, nml variability, +accels, early decels, few to 90s; late decel to 90s, <60mn then back to baseline; variables to 90s TOCO: q 1-2 min  A/P: g3P2 at 39.2 wga Active labor - iupc placed and begin amnioinfusion, anticipate svd; follow fht closely, begin pushing soon

## 2022-06-17 NOTE — Anesthesia Procedure Notes (Signed)
Epidural Patient location during procedure: OB Start time: 06/17/2022 6:51 PM End time: 06/17/2022 6:56 PM  Staffing Anesthesiologist: Nilda Simmer, MD Performed: anesthesiologist   Preanesthetic Checklist Completed: patient identified, IV checked, site marked, risks and benefits discussed, surgical consent, monitors and equipment checked, pre-op evaluation and timeout performed  Epidural Patient position: sitting Prep: DuraPrep and site prepped and draped Patient monitoring: continuous pulse ox and blood pressure Approach: midline Location: L3-L4 Injection technique: LOR saline  Needle:  Needle type: Tuohy  Needle gauge: 17 G Needle length: 9 cm and 9 Needle insertion depth: 4 cm Catheter type: closed end flexible Catheter size: 19 Gauge Catheter at skin depth: 8 cm Test dose: negative  Assessment Events: blood not aspirated, no cerebrospinal fluid, injection not painful, no injection resistance, no paresthesia and negative IV test  Additional Notes The patient has requested an epidural for labor pain management. Risks and benefits including, but not limited to, infection, bleeding, local anesthetic toxicity, headache, hypotension, back pain, block failure, etc. were discussed with the patient. The patient expressed understanding and consented to the procedure. I confirmed that the patient has no bleeding disorders and is not taking blood thinners. I confirmed the patient's last platelet count with the nurse. A time-out was performed immediately prior to the procedure. Please see nursing documentation for vital signs. Sterile technique was used throughout the whole procedure. Once LOR achieved, the epidural catheter threaded easily without resistance. Aspiration of the catheter was negative for blood and CSF. The epidural was dosed slowly and an infusion was started.  -1-- attempt(s)Reason for block:procedure for pain

## 2022-06-17 NOTE — Anesthesia Preprocedure Evaluation (Addendum)
Anesthesia Evaluation  Patient identified by MRN, date of birth, ID band  Reviewed: Allergy & Precautions, NPO status , Patient's Chart, lab work & pertinent test results  History of Anesthesia Complications Negative for: history of anesthetic complications  Airway Mallampati: III  TM Distance: >3 FB Neck ROM: Full    Dental   Pulmonary neg pulmonary ROS   breath sounds clear to auscultation       Cardiovascular negative cardio ROS  Rhythm:Regular Rate:Normal     Neuro/Psych    GI/Hepatic negative GI ROS, Neg liver ROS,,,  Endo/Other  neg diabetes Hyperthyroidism (Grave's disease)   Renal/GU negative Renal ROS     Musculoskeletal   Abdominal   Peds  Hematology negative hematology ROS (+)   Anesthesia Other Findings   Reproductive/Obstetrics (+) Pregnancy                             Anesthesia Physical Anesthesia Plan  ASA: 2  Anesthesia Plan: Epidural   Post-op Pain Management:    Induction:   PONV Risk Score and Plan:   Airway Management Planned:   Additional Equipment:   Intra-op Plan:   Post-operative Plan:   Informed Consent: I have reviewed the patients History and Physical, chart, labs and discussed the procedure including the risks, benefits and alternatives for the proposed anesthesia with the patient or authorized representative who has indicated his/her understanding and acceptance.       Plan Discussed with: Anesthesiologist  Anesthesia Plan Comments: (I have discussed risks of neuraxial anesthesia including but not limited to infection, bleeding, nerve injury, back pain, headache, seizures, and failure of block. Patient denies bleeding disorders and is not currently anticoagulated. Labs have been reviewed. Risks and benefits discussed. All patient's questions answered.  )       Anesthesia Quick Evaluation

## 2022-06-17 NOTE — Plan of Care (Signed)

## 2022-06-17 NOTE — H&P (Signed)
Martha Gutierrez is a 32 y.o. G3P2002 at 74w2dgestation presents for complaint of painful  Contractions. She denies any vb, lof.  Antepartum course:  LGA - efw at 38wga 8'9" (91%), ac 98%; abnormal quad screen but low risk NIPT; hyperthyroid - no meds   [facility] [place] [facility]  See complete pre-natal records  History OB History     Gravida  3   Para  2   Term  2   Preterm      AB      Living  2      SAB      IAB      Ectopic      Multiple  0   Live Births  2          Past Medical History:  Diagnosis Date   Encounter for induction of labor 08/19/2019   Gestational diabetes    Graves' disease 2023   H/O gestational diabetes in prior pregnancy, currently pregnant    Past Surgical History:  Procedure Laterality Date   APPENDECTOMY     LAPAROSCOPIC APPENDECTOMY  08/18/2011   Procedure: APPENDECTOMY LAPAROSCOPIC;  Surgeon: MRolm Bookbinder MD;  Location: [facility];  Service: General;  Laterality: N/A;   None     WISDOM TOOTH EXTRACTION     Family History: family history includes Cancer in her mother; Stroke in her maternal grandfather. Social History:  reports that she has never smoked. She has never used smokeless tobacco. She reports that she does not currently use alcohol. She reports that she does not use drugs.  ROS: See above otherwise negative  Prenatal labs:  ABO, Rh:  pos Antibody:  neg Rubella:  Non-immune RPR:   non-reactive HBsAg:   neg HCV neg HIV:  neg GBS:   neg 1 hr Glucola: Normal Genetic screening:  abnormal quad screen with increased risk DS, low risk NIPT Anatomy [place] Normal  Physical Exam:   Dilation: 7 Effacement (%): 80 Station: -2 Exam by:: RRenella Cunas RN Blood pressure 125/75, pulse 96, temperature 98.3 F (36.8 C), temperature source Oral, resp. rate 18, last menstrual period 09/15/2021, SpO2 100 %, unknown if currently breastfeeding. A&O x 3 HEENT: Normal Lungs: CTAB CV: RRR Abdominal: Soft,  Non-tender, Gravid, and Estimated fetal weight: 8 1/2-9lbs lbs  Lower Extremities: Non-edematous, Non-tender  Pelvic Exam:      Dilatation: 8cm     Effacement: 90%     Station: -2     Presentation: Cephalic; arom with clear fluid, small amount  Labs:  CBC:  Lab Results  Component Value Date   WBC 8.8 06/17/2022   RBC 4.98 06/17/2022   HGB 14.7 06/17/2022   HCT 43.1 06/17/2022   MCV 86.5 06/17/2022   MCH 29.5 06/17/2022   MCHC 34.1 06/17/2022   RDW 14.1 06/17/2022   PLT 302 06/17/2022   CMP:  Lab Results  Component Value Date   NA 135 08/18/2011   K 3.4 (L) 08/18/2011   CL 100 08/18/2011   CO2 25 08/18/2011   GLUCOSE 84 08/18/2011   BUN 9 08/18/2011   CREATININE 0.65 08/18/2011   CALCIUM 8.9 08/18/2011   PROT 7.1 08/18/2011   AST 13 08/18/2011   ALT 10 08/18/2011   ALBUMIN 3.5 08/18/2011   ALKPHOS 52 08/18/2011   BILITOT 0.3 08/18/2011   GFRNONAA >90 08/18/2011   GFRAA >90 08/18/2011   Urine: No results found for: "COLORURINE", "APPEARANCEUR", "LABSPEC", "PHURINE", "GLUCOSEU", "HGBUR", "BILIRUBINUR", "KETONESUR", "PROTEINUR", "NITRITE", "  LEUKOCYTESUR"   Prenatal Transfer Tool  Maternal Diabetes: No Genetic Screening: Abnormal:  Results: Elevated risk of Trisomy 21 on quad but NIPT low risk Maternal Ultrasounds/Referrals: Normal Fetal Ultrasounds or other Referrals:  None Maternal Substance Abuse:  No Significant Maternal Medications:  None Significant Maternal Lab Results: Group B Strep negative Number of Prenatal Visits:greater than 3 verified prenatal visits Other Comments:  None  FHT: 120s, nml variability, +accels, no decels, few quick variables TOCO: 2-3 min  Assessment/Plan:  32 y.o. G3P2002 at 61w2dgestation   Active labor - plan svd, s/p epidural LGA - proven pelvis to 8'12 H/o gdma1 in prior preg, nml bs this preg (was checking d/t lga) Hyperthyroid - no meds; nml levels/neg abs Abnormal quad for DS, low risk NIPT    SCharyl Bigger3/06/2022, 6:59 PM

## 2022-06-17 NOTE — MAU Note (Signed)
.  Martha Gutierrez is a 32 y.o. at 69w2dhere in MAU reporting: ctx that started one hour ago every 3-5 minutes. Patient is also having bloody show. Denies LOF. +FM.   Pain score: 10

## 2022-06-18 ENCOUNTER — Encounter (HOSPITAL_COMMUNITY): Payer: Self-pay | Admitting: Obstetrics and Gynecology

## 2022-06-18 DIAGNOSIS — E0590 Thyrotoxicosis, unspecified without thyrotoxic crisis or storm: Secondary | ICD-10-CM | POA: Diagnosis present

## 2022-06-18 HISTORY — DX: Thyrotoxicosis, unspecified without thyrotoxic crisis or storm: E05.90

## 2022-06-18 LAB — CBC
HCT: 35.7 % — ABNORMAL LOW (ref 36.0–46.0)
Hemoglobin: 12.2 g/dL (ref 12.0–15.0)
MCH: 29.8 pg (ref 26.0–34.0)
MCHC: 34.2 g/dL (ref 30.0–36.0)
MCV: 87.1 fL (ref 80.0–100.0)
Platelets: 243 10*3/uL (ref 150–400)
RBC: 4.1 MIL/uL (ref 3.87–5.11)
RDW: 14.3 % (ref 11.5–15.5)
WBC: 11.9 10*3/uL — ABNORMAL HIGH (ref 4.0–10.5)
nRBC: 0 % (ref 0.0–0.2)

## 2022-06-18 LAB — RPR: RPR Ser Ql: NONREACTIVE

## 2022-06-18 MED ORDER — SENNOSIDES-DOCUSATE SODIUM 8.6-50 MG PO TABS
2.0000 | ORAL_TABLET | Freq: Every day | ORAL | Status: DC
  Administered 2022-06-18 – 2022-06-19 (×2): 2 via ORAL
  Filled 2022-06-18 (×2): qty 2

## 2022-06-18 MED ORDER — DIPHENHYDRAMINE HCL 25 MG PO CAPS
25.0000 mg | ORAL_CAPSULE | Freq: Four times a day (QID) | ORAL | Status: DC | PRN
  Administered 2022-06-18: 25 mg via ORAL
  Filled 2022-06-18: qty 1

## 2022-06-18 MED ORDER — TETANUS-DIPHTH-ACELL PERTUSSIS 5-2.5-18.5 LF-MCG/0.5 IM SUSY: 0.5000 mL | PREFILLED_SYRINGE | Freq: Once | INTRAMUSCULAR | Status: DC

## 2022-06-18 MED ORDER — BENZOCAINE-MENTHOL 20-0.5 % EX AERO
1.0000 | INHALATION_SPRAY | CUTANEOUS | Status: DC | PRN
  Administered 2022-06-18: 1 via TOPICAL
  Filled 2022-06-18: qty 56

## 2022-06-18 MED ORDER — ONDANSETRON HCL 4 MG PO TABS: 4.0000 mg | ORAL_TABLET | ORAL | Status: DC | PRN

## 2022-06-18 MED ORDER — PRENATAL MULTIVITAMIN CH
1.0000 | ORAL_TABLET | Freq: Every day | ORAL | Status: DC
  Administered 2022-06-18 – 2022-06-19 (×2): 1 via ORAL
  Filled 2022-06-18 (×2): qty 1

## 2022-06-18 MED ORDER — SIMETHICONE 80 MG PO CHEW: 80.0000 mg | CHEWABLE_TABLET | ORAL | Status: DC | PRN

## 2022-06-18 MED ORDER — DIBUCAINE (PERIANAL) 1 % EX OINT: 1.0000 | TOPICAL_OINTMENT | CUTANEOUS | Status: DC | PRN

## 2022-06-18 MED ORDER — COCONUT OIL OIL: 1.0000 | TOPICAL_OIL | Status: DC | PRN

## 2022-06-18 MED ORDER — IBUPROFEN 600 MG PO TABS
600.0000 mg | ORAL_TABLET | Freq: Four times a day (QID) | ORAL | Status: DC
  Administered 2022-06-18 – 2022-06-19 (×6): 600 mg via ORAL
  Filled 2022-06-18 (×6): qty 1

## 2022-06-18 MED ORDER — ONDANSETRON HCL 4 MG/2ML IJ SOLN: 4.0000 mg | INTRAMUSCULAR | Status: DC | PRN

## 2022-06-18 MED ORDER — ACETAMINOPHEN 325 MG PO TABS
650.0000 mg | ORAL_TABLET | ORAL | Status: DC | PRN
  Administered 2022-06-18: 650 mg via ORAL
  Filled 2022-06-18: qty 2

## 2022-06-18 MED ORDER — WITCH HAZEL-GLYCERIN EX PADS: 1.0000 | MEDICATED_PAD | CUTANEOUS | Status: DC | PRN

## 2022-06-18 NOTE — Lactation Note (Signed)
This note was copied from a baby's chart. Lactation Consultation Note  Patient Name: Martha Gutierrez [id number] Date: 06/18/2022 Age:34 hours Reason for consult: Follow-up assessment  P3, Mother is experienced with breastfeeding.  Baby sleeping.  Suggest calling for help with latching as needed. Feed on demand with cues.  Goal 8-12+ times per day after first 24 hrs.  Place baby STS if not cueing.   Maternal Data Has patient been taught Hand Expression?: Yes Does the patient have breastfeeding experience prior to this delivery?: Yes  Feeding Mother's Current Feeding Choice: Breast Milk  Interventions Interventions: Education;LC Services brochure  Consult Status Consult Status: Follow-up Date: 06/19/22 Follow-up type: In-patient    [name] [facility] 06/18/2022, 8:23 AM

## 2022-06-18 NOTE — Anesthesia Postprocedure Evaluation (Signed)
Anesthesia Post Note  Patient: Martha Gutierrez  Procedure(s) Performed: AN AD HOC LABOR EPIDURAL     Patient location during evaluation: Mother Baby Anesthesia Type: Epidural Level of consciousness: oriented, awake and awake and alert Pain management: pain level controlled Vital Signs Assessment: post-procedure vital signs reviewed and stable Respiratory status: spontaneous breathing, respiratory function stable and nonlabored ventilation Cardiovascular status: stable Postop Assessment: no headache, adequate PO intake, able to ambulate, patient able to bend at knees and no apparent nausea or vomiting Anesthetic complications: no   No notable events documented.  Last Vitals:  Vitals:   06/18/22 0238 06/18/22 0533  BP: 112/72 112/60  Pulse: 86 83  Resp: 18 18  Temp: 36.9 C 36.7 C  SpO2:      Last Pain:  Vitals:   06/18/22 0726  TempSrc:   PainSc: 0-No pain   Pain Goal:                   [name]

## 2022-06-18 NOTE — Progress Notes (Signed)
   PPD #1 S/P NSVD  Live born female  Birth Weight: 8 lb 4.3 oz (3750 g) APGAR: 9, 9  Newborn Delivery   Birth date/time: 06/17/2022 22:44:05 Delivery type: Vaginal, Spontaneous     Baby name: Martha Gutierrez  Delivering provider: Tiana Loft E   Lacerations: 2nd degree   Circumcision: Yes, planning  Feeding: breast  Pain control at delivery: Epidural   S:  Reports feeling well.              Tolerating PO/No nausea or vomiting             Bleeding is light             Pain controlled with acetaminophen and ibuprofen (OTC)             Up ad lib/ambulatory/voiding without difficulties   O:  A & O x 3, in no apparent distress  Vitals:   06/18/22 0100 06/18/22 0238 06/18/22 0533 06/18/22 1020  BP: 112/68 112/72 112/60 97/62  Pulse: 79 86 83 78  Resp: '18 18 18 18  '$ Temp: 98.4 F (36.9 C) 98.4 F (36.9 C) 98 F (36.7 C) 99.1 F (37.3 C)  TempSrc: Oral Oral Oral Axillary  SpO2:    97%  Weight:      Height:       Recent Labs    06/17/22 1806 06/18/22 0615  WBC 8.8 11.9*  HGB 14.7 12.2  HCT 43.1 35.7*  PLT 302 243    Blood type: --/--/O POS (03/03 1806)  Rubella: Nonimmune (08/16 0000)   I&O: I/O last 3 completed shifts: In: 0  Out: 357 [Urine:250; Blood:107]          No intake/output data recorded.  Gen: AAO x 3, NAD Abdomen: soft, non-tender, non-distended Fundus: firm, non-tender, U-1 Perineum: repair intact Lochia: small Extremities: no edema, no calf pain or tenderness   A/P:  PPD # 1 32 y.o., [id number]  Principal Problem:   Postpartum care following vaginal delivery 3/3  Doing well - stable status  Routine post partum orders Active Problems:   SVD (spontaneous vaginal delivery)   Second-degree perineal laceration, with delivery  Discussed perineal care and comfort measures.    Normal labor and delivery   Hyperthyroidism  No meds  F/U postpartum  Anticipate discharge tomorrow.   Suzan Nailer, MSN, CNM 06/18/2022, 11:39 AM

## 2022-06-18 NOTE — Plan of Care (Signed)
  Problem: Education: Goal: Knowledge of Childbirth will improve 06/18/2022 0100 by Thera Flake, RN Outcome: Completed/Met 06/17/2022 2056 by Thera Flake, RN Outcome: Progressing Goal: Ability to make informed decisions regarding treatment and plan of care will improve 06/18/2022 0100 by Thera Flake, RN Outcome: Completed/Met 06/17/2022 2056 by Thera Flake, RN Outcome: Progressing Goal: Ability to state and carry out methods to decrease the pain will improve 06/18/2022 0100 by Thera Flake, RN Outcome: Completed/Met 06/17/2022 2056 by Thera Flake, RN Outcome: Progressing Goal: Individualized Educational Video(s) 06/18/2022 0100 by Thera Flake, RN Outcome: Completed/Met 06/17/2022 2056 by Thera Flake, RN Outcome: Progressing   Problem: Coping: Goal: Ability to verbalize concerns and feelings about labor and delivery will improve 06/18/2022 0100 by Thera Flake, RN Outcome: Completed/Met 06/17/2022 2056 by Thera Flake, RN Outcome: Progressing   Problem: Life Cycle: Goal: Ability to make normal progression through stages of labor will improve 06/18/2022 0100 by Thera Flake, RN Outcome: Completed/Met 06/17/2022 2056 by Thera Flake, RN Outcome: Progressing Goal: Ability to effectively push during vaginal delivery will improve 06/18/2022 0100 by Thera Flake, RN Outcome: Completed/Met 06/17/2022 2056 by Thera Flake, RN Outcome: Progressing   Problem: Role Relationship: Goal: Will demonstrate positive interactions with the child 06/18/2022 0100 by Thera Flake, RN Outcome: Completed/Met 06/17/2022 2056 by Thera Flake, RN Outcome: Progressing   Problem: Safety: Goal: Risk of complications during labor and delivery will decrease 06/18/2022 0100 by Thera Flake, RN Outcome: Completed/Met 06/17/2022 2056 by Thera Flake, RN Outcome: Progressing   Problem: Pain Management: Goal: Relief or control of pain from uterine  contractions will improve 06/18/2022 0100 by Thera Flake, RN Outcome: Completed/Met 06/17/2022 2056 by Thera Flake, RN Outcome: Progressing

## 2022-06-18 NOTE — Lactation Note (Signed)
This note was copied from a baby's chart. Lactation Consultation Note  Patient Name: Martha Gutierrez [id number] Date: 06/18/2022 Age:34 hours Reason for consult: L&D Initial assessment;Term Birth Parent BF infant on her left breast using the cross cradle hold, infant sustained latch and was still breastfeeding after 11 minutes when LC left the room. Birth Parent knows to continue to BF infant by cues, on demand, 8 to 12+ times within 24 hours, STS. Birth Parent knows to ask RN/LC for further latch assistance on MBU if needed. Birth Parent is experienced with breastfeeding see maternal data below.   Maternal Data Does the patient have breastfeeding experience prior to this delivery?: Yes How long did the patient breastfeed?: Per Birth Parent, BF 1st child for 18 months and 2nd child for 12 months who is 25 years old.  Feeding Mother's Current Feeding Choice: Breast Milk  LATCH Score Latch: Grasps breast easily, tongue down, lips flanged, rhythmical sucking.  Audible Swallowing: Spontaneous and intermittent  Type of Nipple: Everted at rest and after stimulation  Comfort (Breast/Nipple): Soft / non-tender  Hold (Positioning): Assistance needed to correctly position infant at breast and maintain latch.  LATCH Score: 9   Lactation Tools Discussed/Used    Interventions Interventions: Assisted with latch;Support pillows;Adjust position;Skin to skin;Position options;Education;Breast compression;Breast massage  Discharge    Consult Status Consult Status: Follow-up from L&D    Eulis Canner 06/18/2022, 12:22 AM

## 2022-06-19 NOTE — Lactation Note (Addendum)
This note was copied from a baby's chart. Lactation Consultation Note  Patient Name: Martha Gutierrez [id number] Date: 06/19/2022 Age:34 hours Reason for consult: Follow-up assessment;Infant weight loss;Term;Nipple pain/trauma;Breastfeeding assistance (7 %, milk coming in,) Baby post circ. Baby sleepy LC offered to assess breast tissue and noted the areola to have edema and semi compressible . Right nipple positional strip.  LC showed the reverse pressure stretch back exercise and it improved the compressibility of the areola . LC also recommended a dab of coconut oil to soften the areola after feedings and breast shells 10 mins prior to feeding.  LC recommended steps for latching and provided a written plan.  Mom holding baby and mom plans to call with feeding cues.  LC reviewed BF D/C teaching the [facility] resources after delivery.   Maternal Data Has patient been taught Hand Expression?: Yes  Feeding Mother's Current Feeding Choice: Breast Milk  LATCH Score Latch: Too sleepy or reluctant, no latch achieved, no sucking elicited. (baby sleepy from circ)  Audible Swallowing: None  Type of Nipple: Everted at rest and after stimulation (semi tough areolas with edema)  Comfort (Breast/Nipple): Filling, red/small blisters or bruises, mild/mod discomfort  Hold (Positioning): Assistance needed to correctly position infant at breast and maintain latch.  LATCH Score: 4   Lactation Tools Discussed/Used    Interventions Interventions: Breast feeding basics reviewed;Assisted with latch;Skin to skin;Breast massage;Hand express;Pre-pump if needed;Reverse pressure;Breast compression;Adjust position;Support pillows;Shells;Hand pump;Education;LC Services brochure  Discharge Discharge Education: Engorgement and breast care;Warning signs for feeding baby Pump: Personal;Hands Free;Manual WIC Program: No  Consult Status Consult Status: Follow-up Date: 06/19/22 Follow-up type:  In-patient    [place] 06/19/2022, 11:15 AM

## 2022-06-19 NOTE — Discharge Summary (Signed)
OB Discharge Summary  Patient Name: Martha Gutierrez DOB: 1991-03-02 MRN: [id number]  Date of admission: 06/17/2022 Delivering provider: Tiana Loft E   Admitting diagnosis: Normal labor and delivery [O80] Intrauterine pregnancy: [redacted]w[redacted]d    Secondary diagnosis: Patient Active Problem List   Diagnosis Date Noted   Hyperthyroidism 06/18/2022   Normal labor and delivery 06/17/2022   Postpartum care following vaginal delivery 3/3 03/01/2016   SVD (spontaneous vaginal delivery) 03/01/2016   Second-degree perineal laceration, with delivery 03/01/2016    Date of discharge: 06/19/2022   Discharge diagnosis: Principal Problem:   Postpartum care following vaginal delivery 3/3 Active Problems:   SVD (spontaneous vaginal delivery)   Second-degree perineal laceration, with delivery   Normal labor and delivery   Hyperthyroidism                                                            Augmentation: N/A Pain control: Epidural  [id number]  Complications: None  Hospital course:  Onset of Labor With Vaginal Delivery      32y.o. yo G3P3003 at 331w2das admitted in Active Labor on 06/17/2022. Membrane Rupture Time/Date: 7:30 PM ,06/17/2022   Delivery Method:Vaginal, Spontaneous  Lacerations:  2nd degree  Patient had an uncomplicated postpartum course. She is ambulating, tolerating a regular diet, passing flatus, and urinating well. Patient is discharged home in stable condition on 06/19/22.  Newborn Data: Birth date:06/17/2022  Birth time:10:44 PM  Gender:Female  Living status:Living  Apgars:9 ,9  Weight:3750 g   Physical exam  Vitals:   06/18/22 1020 06/18/22 1414 06/18/22 2200 06/19/22 0552  BP: 97/62 (!) 94/50 109/71 99/71  Pulse: 78 88 99 81  Resp: '18 18 18 16  '$ Temp: 99.1 F (37.3 C) 98.4 F (36.9 C) 98.5 F (36.9 C) 98.5 F (36.9 C)  TempSrc: Axillary Oral Oral Oral  SpO2: 97% 96% 98% 98%  Weight:      Height:       General: alert, cooperative, and no  distress Lochia: appropriate Uterine Fundus: firm Perineum: repair intact, no edema DVT Evaluation: No evidence of DVT seen on physical exam.  Labs: Lab Results  Component Value Date   WBC 11.9 (H) 06/18/2022   HGB 12.2 06/18/2022   HCT 35.7 (L) 06/18/2022   MCV 87.1 06/18/2022   PLT 243 06/18/2022      06/18/2022    6:19 PM 08/21/2019    7:41 PM  Edinburgh Postnatal Depression Scale Screening Tool  I have been able to laugh and see the funny side of things. 0 0  I have looked forward with enjoyment to things. 0 0  I have blamed myself unnecessarily when things went wrong. 0 1  I have been anxious or worried for no good reason. 1 1  I have felt scared or panicky for no good reason. 1 0  Things have been getting on top of me. 1 2  I have been so unhappy that I have had difficulty sleeping. 0 0  I have felt sad or miserable. 0 1  I have been so unhappy that I have been crying. 0 1  The thought of harming myself has occurred to me. 0 0  Edinburgh Postnatal Depression Scale Total 3 6   Discharge instructions:  per After Visit Summary  After Visit Meds:  Allergies as of 06/19/2022   No Known Allergies      Medication List     STOP taking these medications    selenium 200 MCG Tabs tablet       TAKE these medications    prenatal multivitamin Tabs tablet Take 1 tablet by mouth daily.       Activity: Advance as tolerated. Pelvic rest for 6 weeks.   Newborn Data: Live born female  Birth Weight: 8 lb 4.3 oz (3750 g) APGAR: 9, 9  Newborn Delivery   Birth date/time: 06/17/2022 22:44:05 Delivery type: Vaginal, Spontaneous     Named Linton Rump Baby Feeding: Breast Circumcision: Completed/Dr. Pamala Hurry Disposition:home with mother  Delivery Report:  Review the Delivery Report for details.    Follow up:  Follow-up Information     Azucena Fallen, MD. Schedule an appointment as soon as possible for a visit in 6 week(s).   Specialty: Obstetrics and Gynecology Contact  information: [place] [address] [place], CNM, MSN 06/19/2022, 9:49 AM

## 2022-06-19 NOTE — Lactation Note (Signed)
This note was copied from a baby's chart. Lactation Consultation Note  Patient Name: Martha Gutierrez [id number] Date: 06/19/2022 Age:34 hours Reason for consult: Follow-up assessment;Infant weight loss;Term;Breastfeeding assistance 2nd [place] visit to check latch for D/C .  Mom 1st did the steps for latching and the [facility] showed dad how he could assist to help mom to latch and to obtain the depth.  Baby opened wide , depth obtained. Multiple swallows and increased with breast compressions. Baby still feeding at 25 mins with swallows and per mom comfortable.  Mom aware to check the nipple to see if its well rounded when baby releases.    Maternal Data Has patient been taught Hand Expression?: Yes  Feeding Mother's Current Feeding Choice: Breast Milk  LATCH Score Latch: Grasps breast easily, tongue down, lips flanged, rhythmical sucking.  Audible Swallowing: Spontaneous and intermittent  Type of Nipple: Everted at rest and after stimulation (steps for latching)  Comfort (Breast/Nipple): Filling, red/small blisters or bruises, mild/mod discomfort  Hold (Positioning): Assistance needed to correctly position infant at breast and maintain latch.  LATCH Score: 8   Lactation Tools Discussed/Used Tools: Shells;Pump;Flanges Flange Size: 24;27 Breast pump type: Manual Pump Education: Milk Storage;Setup, frequency, and cleaning Reason for Pumping: steps for latching due to areola edema  Interventions Interventions: Breast feeding basics reviewed;Assisted with latch;Skin to skin;Breast massage;Hand express;Pre-pump if needed;Reverse pressure;Breast compression;Adjust position;Support pillows;Position options;Shells;Hand pump;Education;LC Services brochure  Discharge Discharge Education: Engorgement and breast care;Warning signs for feeding baby Pump: Hands Free;Manual;Personal WIC Program: No  Consult Status Consult Status: Complete Date: 06/19/22 Follow-up type: In-patient    [place] 06/19/2022, 12:54 PM

## 2022-06-26 ENCOUNTER — Telehealth (HOSPITAL_COMMUNITY): Payer: Self-pay | Admitting: *Deleted

## 2022-06-26 NOTE — Telephone Encounter (Signed)
Patient answered phone. Denied questions or concerns regarding her own health or baby's health. Infant was crying, so patient requested return call tomorrow. Erline Levine, RN, 06/26/22, [phone]

## 2022-06-28 ENCOUNTER — Telehealth (HOSPITAL_COMMUNITY): Payer: Self-pay | Admitting: *Deleted

## 2022-06-28 NOTE — Telephone Encounter (Signed)
Hospital Discharge Follow-Up Call:  Phoned patient to complete Hospital Discharge Follow-Up Call.  Patient reports that she and baby remain well.  Baby has a weight check at pediatrician's office tomorrow.  We reviewed ABCs of Safe Sleep for newborns.  EPDS score today was 4 and she endorses this accurately reflects that she is doing well emotionally.

## 2022-06-29 ENCOUNTER — Telehealth (HOSPITAL_COMMUNITY): Payer: Self-pay | Admitting: Lactation Services

## 2022-06-29 NOTE — Telephone Encounter (Signed)
Ms Martha Gutierrez called with concerns that her baby had not gained weight between pediatrician appointments, 1 week apart. Baby was born on 3/3. Mother said baby has a follow up appt on 07/02/2022. Discussed pumping following breastfeeding and feeding baby expressed milk for additional calories. Recommended OP LC appointment to evaluate milk transfer. Mother states she has called and left message for an appointment.

## 2023-05-04 ENCOUNTER — Encounter (HOSPITAL_BASED_OUTPATIENT_CLINIC_OR_DEPARTMENT_OTHER): Payer: Self-pay

## 2023-05-04 ENCOUNTER — Emergency Department (HOSPITAL_BASED_OUTPATIENT_CLINIC_OR_DEPARTMENT_OTHER)
Admission: EM | Admit: 2023-05-04 | Discharge: 2023-05-04 | Disposition: A | Discharge status: Home / Self Care | Attending: Emergency Medicine | Admitting: Emergency Medicine

## 2023-05-04 DIAGNOSIS — Z1152 Encounter for screening for COVID-19: Secondary | ICD-10-CM | POA: Diagnosis not present

## 2023-05-04 DIAGNOSIS — J029 Acute pharyngitis, unspecified: Secondary | ICD-10-CM | POA: Diagnosis present

## 2023-05-04 DIAGNOSIS — J020 Streptococcal pharyngitis: Secondary | ICD-10-CM | POA: Insufficient documentation

## 2023-05-04 LAB — RESP PANEL BY RT-PCR (RSV, FLU A&B, COVID)  RVPGX2
Influenza A by PCR: NEGATIVE
Influenza B by PCR: NEGATIVE
Resp Syncytial Virus by PCR: NEGATIVE
SARS Coronavirus 2 by RT PCR: NEGATIVE

## 2023-05-04 LAB — GROUP A STREP BY PCR: Group A Strep by PCR: DETECTED — AB

## 2023-05-04 MED ORDER — CLINDAMYCIN HCL 300 MG PO CAPS: 300.0000 mg | ORAL_CAPSULE | Freq: Four times a day (QID) | ORAL | 0 refills | Status: AC

## 2023-05-04 MED ORDER — CLINDAMYCIN HCL 150 MG PO CAPS
300.0000 mg | ORAL_CAPSULE | Freq: Once | ORAL | Status: AC
  Administered 2023-05-04: 300 mg via ORAL
  Filled 2023-05-04: qty 2

## 2023-05-04 MED ORDER — PREDNISONE 10 MG PO TABS: 20.0000 mg | ORAL_TABLET | Freq: Two times a day (BID) | ORAL | 0 refills | Status: AC

## 2023-05-04 MED ORDER — PREDNISONE 20 MG PO TABS
40.0000 mg | ORAL_TABLET | Freq: Once | ORAL | Status: AC
  Administered 2023-05-04: 40 mg via ORAL
  Filled 2023-05-04: qty 2

## 2023-05-04 NOTE — ED Triage Notes (Signed)
Pt POV from home d/t sore throat and swollen tonsils.   Pt states she does not want more ABX - has taken 3 ABX within a month but wants something for inflammation.  Pt is breastfeeding at this time and has Graves Disease.

## 2023-05-04 NOTE — Discharge Instructions (Addendum)
Begin taking clindamycin and prednisone as prescribed.  Follow-up with primary doctor if not improving in the next few days.

## 2023-05-04 NOTE — ED Provider Notes (Signed)
  [place] EMERGENCY DEPARTMENT AT [facility] Provider Note   CSN: [id number] Arrival date & time: 05/04/23  0007     History  Chief Complaint  Patient presents with   Sore Throat    Martha Gutierrez is a 33 y.o. female.  Patient is a 33 year old female with history of Graves' disease on methimazole.  Patient presenting with sore throat.  She has had strep on 3 prior occasions since November.  Each time, she has been treated with an antibiotic, but it seems to return.  She started with symptoms once again yesterday.  No fevers or chills.  Pain is worse with swallowing.  No alleviating factors.  She is currently breast-feeding her 46-month-old child.  The history is provided by the patient.       Home Medications Prior to Admission medications   Medication Sig Start Date End Date Taking? Authorizing Provider  Prenatal Vit-Fe Fumarate-FA (PRENATAL MULTIVITAMIN) TABS tablet Take 1 tablet by mouth daily.    [provider]      Allergies    Patient has no known allergies.    Review of Systems   Review of Systems  All other systems reviewed and are negative.   Physical Exam Updated Vital Signs BP 132/86 (BP Location: Right Arm)   Pulse 99   Temp (!) 97.1 F (36.2 C)   Resp 16   Ht 5\' 4"  (1.626 m)   Wt 59 kg   LMP  (LMP Unknown)   SpO2 100%   Breastfeeding Yes   BMI 22.31 kg/m  Physical Exam Vitals and nursing note reviewed.  Constitutional:      Appearance: She is well-developed.  HENT:     Head: Normocephalic and atraumatic.     Mouth/Throat:     Mouth: Mucous membranes are moist.     Pharynx: Posterior oropharyngeal erythema present. No oropharyngeal exudate.     Comments: There is some erythema of the posterior oropharynx and tonsillar hypertrophy, but is symmetrical and shows no evidence for abscess. Cardiovascular:     Rate and Rhythm: Normal rate.  Skin:    General: Skin is warm and dry.  Neurological:     Mental Status: She is alert.      ED Results / Procedures / Treatments   Labs (all labs ordered are listed, but only abnormal results are displayed) Labs Reviewed  GROUP A STREP BY PCR - Abnormal; Notable for the following components:      Result Value   Group A Strep by PCR DETECTED (*)    All other components within normal limits  RESP PANEL BY RT-PCR (RSV, FLU A&B, COVID)  [id number]    EKG None  Radiology No results found.  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  I will try clindamycin and prednisone and see if this helps.  Patient to follow-up with primary doctor if not improving.  Final Clinical Impression(s) / ED Diagnoses Final diagnoses:  None    Rx / DC Orders ED Discharge Orders     None         Geoffery Lyons, MD 05/04/23 0230

## 2023-05-21 ENCOUNTER — Ambulatory Visit: Admitting: Allergy & Immunology
